# Patient Record
Sex: Male | Born: 1950 | Race: White | Hispanic: No | Marital: Married | Smoking: Never smoker
Health system: Southern US, Community
[De-identification: ages and names within clinical notes are randomized; demographics above are authoritative.]

## PROBLEM LIST (undated history)

## (undated) DIAGNOSIS — E781 Pure hyperglyceridemia: Secondary | ICD-10-CM

## (undated) DIAGNOSIS — M1990 Unspecified osteoarthritis, unspecified site: Secondary | ICD-10-CM

## (undated) DIAGNOSIS — K5790 Diverticulosis of intestine, part unspecified, without perforation or abscess without bleeding: Secondary | ICD-10-CM

## (undated) DIAGNOSIS — K635 Polyp of colon: Secondary | ICD-10-CM

## (undated) DIAGNOSIS — L409 Psoriasis, unspecified: Secondary | ICD-10-CM

## (undated) HISTORY — DX: Polyp of colon: K63.5

## (undated) HISTORY — DX: Pure hyperglyceridemia: E78.1

## (undated) HISTORY — DX: Psoriasis, unspecified: L40.9

## (undated) HISTORY — PX: COLONOSCOPY: SHX174

## (undated) HISTORY — DX: Diverticulosis of intestine, part unspecified, without perforation or abscess without bleeding: K57.90

## (undated) HISTORY — DX: Unspecified osteoarthritis, unspecified site: M19.90

---

## 2006-12-06 ENCOUNTER — Ambulatory Visit: Payer: Self-pay | Admitting: Internal Medicine

## 2006-12-18 ENCOUNTER — Ambulatory Visit: Payer: Self-pay | Admitting: Internal Medicine

## 2006-12-18 ENCOUNTER — Encounter: Payer: Self-pay | Admitting: Internal Medicine

## 2011-11-13 ENCOUNTER — Encounter: Payer: Self-pay | Admitting: Internal Medicine

## 2011-11-20 ENCOUNTER — Encounter: Payer: Self-pay | Admitting: Internal Medicine

## 2012-01-18 ENCOUNTER — Encounter: Payer: Self-pay | Admitting: Internal Medicine

## 2012-01-18 ENCOUNTER — Ambulatory Visit (AMBULATORY_SURGERY_CENTER): Payer: BC Managed Care – PPO | Admitting: *Deleted

## 2012-01-18 VITALS — Ht 74.0 in | Wt 208.0 lb

## 2012-01-18 DIAGNOSIS — Z86010 Personal history of colonic polyps: Secondary | ICD-10-CM

## 2012-01-18 DIAGNOSIS — Z1211 Encounter for screening for malignant neoplasm of colon: Secondary | ICD-10-CM

## 2012-01-18 MED ORDER — MOVIPREP 100 G PO SOLR: ORAL | Status: DC

## 2012-01-31 ENCOUNTER — Encounter: Payer: Self-pay | Admitting: Internal Medicine

## 2012-01-31 ENCOUNTER — Ambulatory Visit (AMBULATORY_SURGERY_CENTER): Payer: BC Managed Care – PPO | Admitting: Internal Medicine

## 2012-01-31 VITALS — BP 155/84 | HR 66 | Temp 97.5°F | Resp 16 | Ht 74.0 in | Wt 208.0 lb

## 2012-01-31 DIAGNOSIS — Z860100 Personal history of colon polyps, unspecified: Secondary | ICD-10-CM

## 2012-01-31 DIAGNOSIS — Z1211 Encounter for screening for malignant neoplasm of colon: Secondary | ICD-10-CM

## 2012-01-31 MED ORDER — SODIUM CHLORIDE 0.9 % IV SOLN: 500.0000 mL | INTRAVENOUS | Status: DC

## 2012-01-31 NOTE — Progress Notes (Signed)
Patient did not experience any of the following events: a burn prior to discharge; a fall within the facility; wrong site/side/patient/procedure/implant event; or a hospital transfer or hospital admission upon discharge from the facility. [phone]) Patient did not have preoperative order for IV antibiotic SSI prophylaxis. [phone])   Pt. Did not pass gas prior to discharge.  Abdomen soft, non tender.  Pt. States he has no need to pass gas.  Placed in trendelenberg Without any results.  Advised to be up and around walking today to facilitate gas.

## 2012-01-31 NOTE — Patient Instructions (Addendum)

## 2012-01-31 NOTE — Op Note (Signed)
[facility] [address] [place], [place]  [address]  COLONOSCOPY PROCEDURE REPORT  PATIENT:  Shane Larson, Shane Larson  MR#:  [id number] BIRTHDATE:  [birth year], 60 yrs. old  GENDER:  male ENDOSCOPIST:  Wilhemina Bonito. Eda Keys, MD REF. BY:  Surveillance Program Recall, PROCEDURE DATE:  01/31/2012 PROCEDURE:  Surveillance Colonoscopy ASA CLASS:  Class I INDICATIONS:  history of pre-cancerous (adenomatous) colon polyps, surveillance and high-risk screening ; index exam 11-2006 w/ small TAs MEDICATIONS:   MAC sedation, administered by CRNA, propofol (Diprivan) 350 mg IV  DESCRIPTION OF PROCEDURE:   After the risks benefits and alternatives of the procedure were thoroughly explained, informed consent was obtained.  Digital rectal exam was performed and revealed no abnormalities.   The LB CF-[id number] endoscope was introduced through the anus and advanced to the cecum, which was identified by both the appendix and ileocecal valve, without limitations.  The quality of the prep was excellent, using MoviPrep.  The instrument was then slowly withdrawn as the colon was fully examined. <<PROCEDUREIMAGES>>  FINDINGS:  Mild diverticulosis was found in the sigmoid colon. Otherwise normal colonoscopy without  polyps, masses, vascular ectasias, or inflammatory changes.   Retroflexed views in the rectum revealed internal hemorrhoids.    The time to cecum =  2:29 minutes. The scope was then withdrawn in  9:35  minutes from the cecum and the procedure completed.  COMPLICATIONS:  None  ENDOSCOPIC IMPRESSION: 1) Mild diverticulosis in the sigmoid colon 2) Otherwise normal colonoscopy 3) Internal hemorrhoids  RECOMMENDATIONS: 1) Follow up colonoscopy in 5 years  ______________________________ Wilhemina Bonito. Eda Keys, MD  CC:  Kari Baars, MD;  The Patient  n. eSIGNED:   Wilhemina Bonito. Eda Keys at 01/31/2012 10:28 AM  Val Riles, [phone]

## 2012-02-01 ENCOUNTER — Telehealth: Payer: Self-pay | Admitting: *Deleted

## 2012-02-01 NOTE — Telephone Encounter (Signed)
  Follow up Call-  Call back number 01/31/2012  Post procedure Call Back phone  # [phone]  Permission to leave phone message Yes     Patient questions:  Do you have a fever, pain , or abdominal swelling? no Pain Score  0 *  Have you tolerated food without any problems? yes  Have you been able to return to your normal activities? yes  Do you have any questions about your discharge instructions: Diet   no Medications  no Follow up visit  no  Do you have questions or concerns about your Care? no  Actions: * If pain score is 4 or above: No action needed, pain <4.

## 2014-01-08 ENCOUNTER — Ambulatory Visit (INDEPENDENT_AMBULATORY_CARE_PROVIDER_SITE_OTHER): Payer: BC Managed Care – PPO | Admitting: Family Medicine

## 2014-01-08 VITALS — BP 128/78 | HR 75 | Temp 97.8°F | Resp 16 | Ht 73.0 in | Wt 199.2 lb

## 2014-01-08 DIAGNOSIS — L03019 Cellulitis of unspecified finger: Secondary | ICD-10-CM

## 2014-01-08 DIAGNOSIS — L4050 Arthropathic psoriasis, unspecified: Secondary | ICD-10-CM

## 2014-01-08 DIAGNOSIS — L03011 Cellulitis of right finger: Secondary | ICD-10-CM

## 2014-01-08 DIAGNOSIS — L408 Other psoriasis: Secondary | ICD-10-CM

## 2014-01-08 DIAGNOSIS — L409 Psoriasis, unspecified: Secondary | ICD-10-CM | POA: Insufficient documentation

## 2014-01-08 MED ORDER — SULFAMETHOXAZOLE-TMP DS 800-160 MG PO TABS: 1.0000 | ORAL_TABLET | Freq: Two times a day (BID) | ORAL | Status: DC

## 2014-01-08 MED ORDER — HYDROCODONE-ACETAMINOPHEN 5-325 MG PO TABS: 1.0000 | ORAL_TABLET | Freq: Four times a day (QID) | ORAL | Status: DC | PRN

## 2014-01-08 MED ORDER — SULFAMETHOXAZOLE-TMP DS 800-160 MG PO TABS
1.0000 | ORAL_TABLET | Freq: Two times a day (BID) | ORAL | Status: DC
  Administered 2014-01-08: 1 via ORAL

## 2014-01-08 MED ORDER — SULFAMETHOXAZOLE-TMP DS 800-160 MG PO TABS
1.0000 | ORAL_TABLET | Freq: Once | ORAL | Status: AC
  Administered 2014-01-08: 1 via ORAL

## 2014-01-08 NOTE — Progress Notes (Signed)
Subjective: 63 year old man who had his finger start getting swollen and red yesterday morning. The right index finger. It is apparently had a previous infection at the base of the nail, which is caused the nail to be a little dystrophic. Is not diabetic  Objective: Very swollen and red right index finger, with the finger somewhat reddish always to the base of it. The swelling is most prominent right along the base of the fingernail. It is just a little bit fluctuant. He has a little deformity of the nail where he squeezed little pus out yesterday. He is wanting to be able to use it to play guitar tomorrow night  Assessment: Very inflamed paronychia right index finger  Plan: I&D Bactrim twice daily Hydrocodone for pain

## 2014-01-08 NOTE — Patient Instructions (Addendum)
Take Bactrim one twice daily for infection  Take hydrocodone every 4-6 hours if needed for throbbing severe pain.  Take ibuprofen 6-800 mg 3 times daily if needed for moderate pain  Return as needed

## 2014-01-08 NOTE — Progress Notes (Signed)
Procedure:  Consent obtained - MC block with 2% lido.  Local anesthesia obtained - betadine prep.  #11 blade used to make a hockey stick shaped - incision along the lateral aspect of the nail fold.  No purulent drainage from the area.  Drsg placed.  Wound care d/w pt with warm compresses and RTC in 24h if worse 48h if no better.

## 2014-01-09 ENCOUNTER — Telehealth: Payer: Self-pay

## 2014-01-09 NOTE — Telephone Encounter (Signed)
Patient was recently seen by Dr. Linna Darner. He was told to call back if he is feeling worse. He says he believes the infection is getting worse. He was told by Dr. Linna Darner to ask to speak to Windell Hummingbird about this.  Best: cell: [phone]

## 2014-01-11 NOTE — Telephone Encounter (Signed)
LMVM for pt to CB. 

## 2014-01-11 NOTE — Telephone Encounter (Signed)
Pt's questions answered. He was in a lot of pain Sat when he called but it's subsiding now.

## 2014-01-13 ENCOUNTER — Ambulatory Visit (INDEPENDENT_AMBULATORY_CARE_PROVIDER_SITE_OTHER): Payer: BC Managed Care – PPO | Admitting: Family Medicine

## 2014-01-13 ENCOUNTER — Telehealth: Payer: Self-pay

## 2014-01-13 VITALS — BP 128/78 | HR 58 | Temp 98.2°F | Resp 16 | Ht 73.0 in | Wt 202.4 lb

## 2014-01-13 DIAGNOSIS — IMO0002 Reserved for concepts with insufficient information to code with codable children: Secondary | ICD-10-CM

## 2014-01-13 DIAGNOSIS — L03011 Cellulitis of right finger: Secondary | ICD-10-CM

## 2014-01-13 MED ORDER — CEPHALEXIN 500 MG PO CAPS: 500.0000 mg | ORAL_CAPSULE | Freq: Four times a day (QID) | ORAL | Status: DC

## 2014-01-13 NOTE — Telephone Encounter (Signed)
Patient called. States he was seen for a cut under his finger and it is still swollen. Please return call and advise. Thank you!

## 2014-01-13 NOTE — Progress Notes (Signed)
63 yo man with right index finger paronychia lanced one week ago and treated with Bactrim.  He continues to have redness and swelling in the distal two phalanges.  The pain has largely subsided though  Objective:  NAD The distal two phalanges of the right index finger are mildly swollen and erythematous with no active discharge from the cuticle. ROM is normal and there is only mild tenderness   Assessment: I would have expected more resolution by now.  Paronychia, right - Plan: cephALEXin (KEFLEX) 500 MG capsule  Signed, Robyn Haber, MD  Stop Bactrim

## 2014-01-13 NOTE — Telephone Encounter (Signed)
Per OV notes pt was to RTC if not better in 48 hrs.  Called pt and advised him to RTC.  Pt stated he would come this afternoon.

## 2014-01-13 NOTE — Patient Instructions (Signed)

## 2014-01-15 NOTE — Addendum Note (Signed)
Addended by: HOPPER, DAVID H on: 01/15/2014 05:46 PM   Modules accepted: Level of Service

## 2014-01-20 ENCOUNTER — Ambulatory Visit (INDEPENDENT_AMBULATORY_CARE_PROVIDER_SITE_OTHER): Payer: BC Managed Care – PPO

## 2014-01-20 ENCOUNTER — Ambulatory Visit (INDEPENDENT_AMBULATORY_CARE_PROVIDER_SITE_OTHER): Payer: BC Managed Care – PPO | Admitting: Family Medicine

## 2014-01-20 VITALS — BP 153/85 | HR 66 | Temp 98.3°F | Resp 18 | Ht 73.0 in | Wt 202.0 lb

## 2014-01-20 DIAGNOSIS — IMO0002 Reserved for concepts with insufficient information to code with codable children: Secondary | ICD-10-CM

## 2014-01-20 DIAGNOSIS — M79644 Pain in right finger(s): Secondary | ICD-10-CM

## 2014-01-20 DIAGNOSIS — M79609 Pain in unspecified limb: Secondary | ICD-10-CM

## 2014-01-20 DIAGNOSIS — L03011 Cellulitis of right finger: Secondary | ICD-10-CM

## 2014-01-20 MED ORDER — CEPHALEXIN 500 MG PO CAPS: 500.0000 mg | ORAL_CAPSULE | Freq: Four times a day (QID) | ORAL | Status: DC

## 2014-01-20 NOTE — Progress Notes (Signed)
[facility] [address], [place] [address] [phone]  Date:  01/20/2014   Name:  Shane Larson   DOB:  [birth year]   MRN:  [id number]  PCP:  Marton Redwood, MD    Chief Complaint: Follow-up   History of Present Illness:  Shane Larson is a 63 y.o. very pleasant male patient who presents with the following:  Here today to recheck a finger issue.  He was originally here on 6/19 with a paronychia of the right index finger, treated with I and D/ bactrim.  He came back on the 24th and was given keflex because he was still symptomatic.   He just finished his course of keflex today- he is concerned because the finger is still somewhat stiff and swollen.  However it is no longer tender.  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   Otherwise he is now feeling ok.    He does have a history of psoriasis and psoriatic arthritis  Patient Active Problem List   Diagnosis Date Noted  . Psoriatic arthritis 01/08/2014  . Psoriasis 01/08/2014    Past Medical History  Diagnosis Date  . Colon polyps     adenomatous  . Diverticulosis   . Hemorrhoids   . Psoriasis     Past Surgical History  Procedure Laterality Date  . Colonoscopy      History  Substance Use Topics  . Smoking status: Never Smoker   . Smokeless tobacco: Never Used  . Alcohol Use: 12.6 oz/week    21 Cans of beer per week    Family History  Problem Relation Age of Onset  . Colon cancer Neg Hx     Allergies  Allergen Reactions  . Codeine Itching    Medication list has been reviewed and updated.  Current Outpatient Prescriptions on File Prior to Visit  Medication Sig Dispense Refill  . cephALEXin (KEFLEX) 500 MG capsule Take 1 capsule (500 mg total) by mouth 4 (four) times daily.  28 capsule  0  . HYDROcodone-acetaminophen (NORCO) 5-325 MG per tablet Take 1 tablet by mouth every 6 (six) hours as needed.  30  tablet  0  . LOVAZA 1 G capsule Take 2 tablets by mouth Daily.      . Multiple Vitamin (MULTIVITAMIN) tablet Take 1 tablet by mouth daily.      . Cholecalciferol (VITAMIN D-3 PO) Take 1 tablet by mouth daily.       No current facility-administered medications on file prior to visit.    Review of Systems:  As per HPI- otherwise negative.   Physical Examination: Filed Vitals:   01/20/14 1403  BP: 153/85  Pulse: 66  Temp: 98.3 F (36.8 C)  Resp: 18   Filed Vitals:   01/20/14 1403  Height: 6\' 1"  (1.854 m)  Weight: 202 lb (91.627 kg)   Body mass index is 26.66 kg/(m^2). Ideal Body Weight: Weight in (lb) to have BMI = 25: 189.1   GEN: WDWN, NAD, Non-toxic, Alert & Oriented x 3, looks well HEENT: Atraumatic, Normocephalic.  Ears and Nose: No external deformity. EXTR: No clubbing/cyanosis/edema NEURO: Normal gait.  PSYCH: Normally interactive. Conversant. Not depressed or anxious appearing.  Calm demeanor.  Right index finger: he has what appears to be chronic inflammation of the distal phalanx, and some stiffness of the DIP joint. No heat or tenderness.  He has about 50% normal  flexion of the joint.    [facility] reading (PRIMARY) by  Dr. Lorelei Pont. 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  FINDINGS: The radiocarpal joint space appears normal and the carpal bones are in normal position. The ulnar styloid is intact. MCP, PIP, and DIP joints appear normal. No erosion is seen. The tufts of the distal phalanges appear intact. No demineralization is noted.  IMPRESSION: Negative.  Assessment and Plan: Pain of finger of right hand - Plan: DG Hand Complete Right  Paronychia, right - Plan: cephALEXin (KEFLEX) 500 MG capsule  At this point Albertina Parr does not appear to have an active infection.  However the end of his finger is still red and slightly swollen.  I do think he can go back to his usual activities and music.   In fact moving the finger will likely allow him to regain ROM.  Did give him an antibiotic rx to hold- he can start this if the finger gets worse again.   See patient instructions for more details.     Signed Lamar Blinks, MD

## 2014-01-20 NOTE — Telephone Encounter (Signed)
Pt called in and said he had seen Dr Joseph Art for an infected finger-he is taking the last of the antibiotic today and states that it doesn't look much better. He says it is still swelling and red. I advised him that Dr Joseph Art is off till the 16th and he just wanted someone to call him and to discuss what he should do. He can be reached [phone]. Thank you

## 2014-01-20 NOTE — Telephone Encounter (Signed)
Pt is on his last dose of antibiotic. He reports the finger is unchanged from his visit.  Advised pt to RTC. States he will be in today or tomorrow morning.

## 2014-01-20 NOTE — Patient Instructions (Signed)
You can go back to your regular activities and music.  If your finger does not regain it's range of motion in the next month or so just let me know and I will refer you to hand therapy.  For the time being hold onto the keflex antibiotic- use this if you develop any worsening redness or do not continue to improve.

## 2014-01-28 ENCOUNTER — Telehealth: Payer: Self-pay

## 2014-01-28 NOTE — Telephone Encounter (Signed)
PATIENT RECEIVED X-RAY RESULTS AND WOULD LIKE TO KNOW IF IT'S THE RIGHT X-RAY OR IF THERE COULD BE A POSSIBLE MIX UP. PATIENT WOULD PREFER TO SPEAK WITH DR. Lorelei Pont BUT IS ALSO WILLING TO RECEIVE A CALL BACK FROM THE FIRST AVAILABLE. PLEASE CALL PATIENT! : [phone]

## 2014-01-28 NOTE — Telephone Encounter (Signed)
[facility] reading (PRIMARY) by Dr. Lorelei Pont.  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 Thanks

## 2014-01-29 NOTE — Telephone Encounter (Signed)
Called him back- his finger is better, the last signs of redness and swelling are going away.  Offered to have him see PT or ortho- he would prefer to see how he does over the next week or so and will let me know

## 2014-02-09 ENCOUNTER — Telehealth: Payer: Self-pay

## 2014-02-09 NOTE — Telephone Encounter (Signed)
Spoke pt- he states he is doing well. He still has a little inflammation near the bone spur. He has been playing guitar and the swelling has gone down a lot. He is going to wait to see how well it heals. He does not want to be referred to Ortho or PT.

## 2014-02-09 NOTE — Telephone Encounter (Signed)
PT STATES HE SAW DR COPLAND THE OTHER DAY AND WOULD LIKE TO SPEAK WITH HER REGARDING HIS VISIT PLEASE CALL [phone]

## 2014-10-12 ENCOUNTER — Other Ambulatory Visit: Payer: Self-pay | Admitting: Internal Medicine

## 2014-10-12 DIAGNOSIS — R740 Nonspecific elevation of levels of transaminase and lactic acid dehydrogenase [LDH]: Principal | ICD-10-CM

## 2014-10-12 DIAGNOSIS — R601 Generalized edema: Secondary | ICD-10-CM

## 2014-10-12 DIAGNOSIS — R7401 Elevation of levels of liver transaminase levels: Secondary | ICD-10-CM

## 2014-10-13 ENCOUNTER — Ambulatory Visit
Admission: RE | Admit: 2014-10-13 | Discharge: 2014-10-13 | Disposition: A | Discharge status: Home / Self Care | Payer: BLUE CROSS/BLUE SHIELD | Source: Ambulatory Visit | Attending: Internal Medicine | Admitting: Internal Medicine

## 2014-10-13 ENCOUNTER — Other Ambulatory Visit: Payer: Self-pay | Admitting: Internal Medicine

## 2014-10-13 ENCOUNTER — Encounter: Payer: Self-pay | Admitting: Internal Medicine

## 2014-10-13 DIAGNOSIS — R740 Nonspecific elevation of levels of transaminase and lactic acid dehydrogenase [LDH]: Principal | ICD-10-CM

## 2014-10-13 DIAGNOSIS — R601 Generalized edema: Secondary | ICD-10-CM

## 2014-10-13 DIAGNOSIS — R7401 Elevation of levels of liver transaminase levels: Secondary | ICD-10-CM

## 2014-10-13 DIAGNOSIS — K7469 Other cirrhosis of liver: Secondary | ICD-10-CM

## 2014-10-20 ENCOUNTER — Other Ambulatory Visit: Payer: Self-pay | Admitting: Internal Medicine

## 2014-10-20 DIAGNOSIS — K7469 Other cirrhosis of liver: Secondary | ICD-10-CM

## 2014-10-22 ENCOUNTER — Ambulatory Visit
Admission: RE | Admit: 2014-10-22 | Discharge: 2014-10-22 | Disposition: A | Discharge status: Home / Self Care | Payer: BLUE CROSS/BLUE SHIELD | Source: Ambulatory Visit | Attending: Internal Medicine | Admitting: Internal Medicine

## 2014-10-22 DIAGNOSIS — K7469 Other cirrhosis of liver: Secondary | ICD-10-CM

## 2014-10-22 MED ORDER — IOPAMIDOL (ISOVUE-300) INJECTION 61%
100.0000 mL | Freq: Once | INTRAVENOUS | Status: AC | PRN
  Administered 2014-10-22: 100 mL via INTRAVENOUS

## 2014-10-26 ENCOUNTER — Encounter (HOSPITAL_COMMUNITY): Payer: Self-pay

## 2014-10-26 ENCOUNTER — Other Ambulatory Visit (HOSPITAL_COMMUNITY): Payer: Self-pay | Admitting: Internal Medicine

## 2014-10-26 ENCOUNTER — Ambulatory Visit (HOSPITAL_COMMUNITY)
Admission: RE | Admit: 2014-10-26 | Discharge: 2014-10-26 | Disposition: A | Discharge status: Home / Self Care | Payer: BLUE CROSS/BLUE SHIELD | Source: Ambulatory Visit | Attending: Internal Medicine | Admitting: Internal Medicine

## 2014-10-26 ENCOUNTER — Other Ambulatory Visit: Payer: Self-pay | Admitting: Internal Medicine

## 2014-10-26 DIAGNOSIS — Z79899 Other long term (current) drug therapy: Secondary | ICD-10-CM | POA: Diagnosis not present

## 2014-10-26 DIAGNOSIS — R160 Hepatomegaly, not elsewhere classified: Secondary | ICD-10-CM

## 2014-10-26 DIAGNOSIS — R7989 Other specified abnormal findings of blood chemistry: Secondary | ICD-10-CM | POA: Diagnosis not present

## 2014-10-26 DIAGNOSIS — R109 Unspecified abdominal pain: Secondary | ICD-10-CM | POA: Insufficient documentation

## 2014-10-26 DIAGNOSIS — K589 Irritable bowel syndrome without diarrhea: Secondary | ICD-10-CM | POA: Diagnosis not present

## 2014-10-26 DIAGNOSIS — R600 Localized edema: Secondary | ICD-10-CM | POA: Insufficient documentation

## 2014-10-26 DIAGNOSIS — L409 Psoriasis, unspecified: Secondary | ICD-10-CM | POA: Diagnosis not present

## 2014-10-26 DIAGNOSIS — K5790 Diverticulosis of intestine, part unspecified, without perforation or abscess without bleeding: Secondary | ICD-10-CM | POA: Diagnosis not present

## 2014-10-26 DIAGNOSIS — Z5329 Procedure and treatment not carried out because of patient's decision for other reasons: Secondary | ICD-10-CM | POA: Insufficient documentation

## 2014-10-26 LAB — CBC
HCT: 46.3 % (ref 39.0–52.0)
HEMOGLOBIN: 15.6 g/dL (ref 13.0–17.0)
MCH: 31.6 pg (ref 26.0–34.0)
MCHC: 33.7 g/dL (ref 30.0–36.0)
MCV: 93.9 fL (ref 78.0–100.0)
Platelets: 69 10*3/uL — ABNORMAL LOW (ref 150–400)
RBC: 4.93 MIL/uL (ref 4.22–5.81)
RDW: 13 % (ref 11.5–15.5)
WBC: 8.7 10*3/uL (ref 4.0–10.5)

## 2014-10-26 LAB — PROTIME-INR
INR: 1.12 (ref 0.00–1.49)
PROTHROMBIN TIME: 14.5 s (ref 11.6–15.2)

## 2014-10-26 LAB — APTT: APTT: 24 s (ref 24–37)

## 2014-10-26 MED ORDER — FENTANYL CITRATE 0.05 MG/ML IJ SOLN
INTRAMUSCULAR | Status: AC | PRN
  Administered 2014-10-26 (×3): 25 ug via INTRAVENOUS

## 2014-10-26 MED ORDER — GELATIN ABSORBABLE 12-7 MM EX MISC
CUTANEOUS | Status: AC
  Filled 2014-10-26: qty 1

## 2014-10-26 MED ORDER — MIDAZOLAM HCL 2 MG/2ML IJ SOLN
INTRAMUSCULAR | Status: AC
  Filled 2014-10-26: qty 2

## 2014-10-26 MED ORDER — LIDOCAINE-EPINEPHRINE 1 %-1:100000 IJ SOLN
INTRAMUSCULAR | Status: AC
  Filled 2014-10-26: qty 1

## 2014-10-26 MED ORDER — FENTANYL CITRATE 0.05 MG/ML IJ SOLN
INTRAMUSCULAR | Status: AC
  Filled 2014-10-26: qty 2

## 2014-10-26 MED ORDER — SODIUM CHLORIDE 0.9 % IV SOLN
INTRAVENOUS | Status: DC
Start: 2014-10-26 — End: 2014-10-27

## 2014-10-26 MED ORDER — MIDAZOLAM HCL 2 MG/2ML IJ SOLN
INTRAMUSCULAR | Status: AC | PRN
  Administered 2014-10-26 (×3): 0.5 mg via INTRAVENOUS

## 2014-10-26 NOTE — Procedures (Signed)
Technically successful US guided biopsy of indeterminate lesion in left lobe of liver.  No immediate complications.

## 2014-10-26 NOTE — Progress Notes (Signed)
Pt. Refuses to wait any further for procedure; states he and friend "have to go to [place] today".  Pt states he did not know he would have bedrest following procedure.  Dr. Martinique notified; Rescheduled for 10/27/14 1000. Pt instructed to be at admitting at 0800, also he would need transportation home.  Pt. Verbalizes understanding.

## 2014-10-26 NOTE — Discharge Instructions (Signed)
Liver Biopsy, Care After °These instructions give you information on caring for yourself after your procedure. Your doctor may also give you more specific instructions. Call your doctor if you have any problems or questions after your procedure. °HOME CARE °· Rest at home for 1-2 days or as told by your doctor. °· Have someone stay with you for at least 24 hours. °· Do not do these things in the first 24 hours: °¨ Drive. °¨ Use machinery. °¨ Take care of other people. °¨ Sign legal documents. °¨ Take a bath or shower. °· There are many different ways to close and cover a cut (incision). For example, a cut can be closed with stitches, skin glue, or adhesive strips. Follow your doctor's instructions on: °¨ Taking care of your cut. °¨ Changing and removing your bandage (dressing). °¨ Removing whatever was used to close your cut. °· Do not drink alcohol in the first week. °· Do not lift more than 5 pounds or play contact sports for the first 2 weeks. °· Take medicines only as told by your doctor. For 1 week, do not take medicine that has aspirin in it or medicines like ibuprofen. °· Get your test results. °GET HELP IF: °· A cut bleeds and leaves more than just a small spot of blood. °· A cut is red, puffs up (swells), or hurts more than before. °· Fluid or something else comes from a cut. °· A cut smells bad. °· You have a fever or chills. °GET HELP RIGHT AWAY IF: °· You have swelling, bloating, or pain in your belly (abdomen). °· You get dizzy or faint. °· You have a rash. °· You feel sick to your stomach (nauseous) or throw up (vomit). °· You have trouble breathing, feel short of breath, or feel faint. °· Your chest hurts. °· You have problems talking or seeing. °· You have trouble balancing or moving your arms or legs. °Document Released: 04/17/2008 Document Revised: 11/23/2013 Document Reviewed: 09/04/2013 °[employer]® Patient Information ©2015 [employer]. This information is not intended to replace advice given to  you by your health care provider. Make sure you discuss any questions you have with your health care provider. ° °

## 2014-10-26 NOTE — H&P (Signed)
Chief Complaint: Abd pain; lower extremity edema Liver lesions  Referring Physician(s): Shaw,William  History of Present Illness: Shane Larson is a 64 y.o. male   Pt complained of abd pain; bloating and gas 2 months ago MD treated pt for IBS Sxs did not resolve and noted B leg edema Was then treated for edema and work up revealed increased Liver function tests US shows irregular liver with multiple lesions CT to follow confirms findings Request for liver lesion biopsy    Past Medical History  Diagnosis Date  . Colon polyps     adenomatous  . Diverticulosis   . Hemorrhoids   . Psoriasis     Past Surgical History  Procedure Laterality Date  . Colonoscopy      Allergies: Codeine  Medications: Prior to Admission medications   Medication Sig Start Date End Date Taking? Authorizing Provider  LOVAZA 1 G capsule Take 2 tablets by mouth Daily. 12/28/11  Yes Historical Provider, MD  Multiple Vitamin (MULTIVITAMIN) tablet Take 1 tablet by mouth daily.   Yes Historical Provider, MD  cephALEXin (KEFLEX) 500 MG capsule Take 1 capsule (500 mg total) by mouth 4 (four) times daily. Patient not taking: Reported on 10/26/2014 01/20/14   Darreld Mclean, MD  HYDROcodone-acetaminophen (NORCO) 5-325 MG per tablet Take 1 tablet by mouth every 6 (six) hours as needed. Patient not taking: Reported on 10/26/2014 01/08/14   Posey Boyer, MD     Family History  Problem Relation Age of Onset  . Colon cancer Neg Hx     History   Social History  . Marital Status: Married    Spouse Name: N/A  . Number of Children: N/A  . Years of Education: N/A   Social History Main Topics  . Smoking status: Never Smoker   . Smokeless tobacco: Never Used  . Alcohol Use: 12.6 oz/week    21 Cans of beer per week  . Drug Use: No  . Sexual Activity: Not on file   Other Topics Concern  . None   Social History Narrative    Review of Systems: A 12 point ROS discussed and pertinent positives are  indicated in the HPI above.  All other systems are negative.  Review of Systems  Constitutional: Positive for activity change, appetite change and fatigue.  Respiratory: Negative for cough and shortness of breath.   Cardiovascular: Positive for leg swelling. Negative for chest pain.  Gastrointestinal: Positive for nausea, abdominal pain and abdominal distention. Negative for vomiting.  Neurological: Positive for weakness.  Psychiatric/Behavioral: Negative for behavioral problems and confusion.    Vital Signs: BP 158/75 mmHg  Pulse 43  Temp(Src) 98.3 F (36.8 C) (Oral)  Resp 18  Ht 6' 2.5" (1.892 m)  Wt 93.895 kg (207 lb)  BMI 26.23 kg/m2  SpO2 97%  Physical Exam  Constitutional: He is oriented to person, place, and time. He appears well-developed.  thin  Cardiovascular: Normal rate, regular rhythm and normal heart sounds.   No murmur heard. Pulmonary/Chest: Effort normal and breath sounds normal. He has no wheezes.  Abdominal: Soft. He exhibits distension. There is tenderness.  Musculoskeletal: Normal range of motion.  Neurological: He is alert and oriented to person, place, and time.  Skin: Skin is warm and dry.  Psychiatric: He has a normal mood and affect. His behavior is normal. Judgment and thought content normal.  Nursing note and vitals reviewed.   Mallampati Score:  MD Evaluation Airway: WNL Heart: WNL Abdomen: WNL Chest/ Lungs:  WNL ASA  Classification: 3 Mallampati/Airway Score: One  Imaging: Ct Abdomen Pelvis W Wo Contrast  10/22/2014   CLINICAL DATA:  Abnormal liver ultrasound  EXAM: CT ABDOMEN AND PELVIS WITHOUT AND WITH CONTRAST  TECHNIQUE: Multidetector CT imaging of the abdomen and pelvis was performed following the standard protocol before and following the bolus administration of intravenous contrast.  CONTRAST:  100 mL Isovue 300 IV  COMPARISON:  Abdominal ultrasound dated 10/13/2014  FINDINGS: Lower chest:  Lung bases are clear.  Hepatobiliary:  Innumerable hypoenhancing lesions throughout the liver. Dominant discrete lesion measures 6.1 x 4.6 cm in the posterior segment right hepatic lobe (series 8/image 50). Additional discrete lesion in the superior right hepatic dome measures 2.4 x 3.3 cm (series 8/ image 35). This appearance is worrisome for metastatic disease. Infiltrating hepatocellular tumor is considered less likely.  Overlying nodular hepatic contour of the liver, possibly reflecting cirrhosis, although "pseudocirrhosis" (related to metastases) is also possible.  Gallbladder is underdistended but unremarkable. No intrahepatic or extrahepatic ductal dilatation.  Pancreas: Within normal limits.  Spleen: Within normal limits.  Adrenals/Urinary Tract: Adrenal glands are thickened bilaterally. No discrete mass.  Kidneys are within normal limits.  No hydronephrosis.  Bladder is within normal limits.  Stomach/Bowel: Stomach is within normal limits.  No evidence of bowel obstruction.  Normal appendix.  Moderate colonic stool burden.  Vascular/Lymphatic: Atherosclerotic calcifications of the abdominal aorta and branch vessels.  Portal vein is patent.  Small upper abdominal lymph nodes which do not meet pathologic CT size criteria.  Reproductive: Mild prostatomegaly.  Other: Trace perihepatic and pelvic ascites.  Body wall edema.  Musculoskeletal: Degenerative changes of the visualized thoracolumbar spine.  IMPRESSION: Innumerable hypoenhancing hepatic lesions, suspicious for metastases, less likely infiltrating HCC.  Primary neoplasm in the abdomen/pelvis is not evident. Consider chest radiograph/CT for evaluation of lung primary. If a lung primary is not discovered, consider colonoscopy.  Bilateral adrenal thickening, indeterminate.  Small volume perihepatic and pelvic ascites.  Body wall edema.  These results will be called to the ordering clinician or representative by the Radiologist Assistant, and communication documented in the PACS or zVision  Dashboard.   Electronically Signed   By: Julian Hy M.D.   On: 10/22/2014 18:53   US Abdomen Complete  10/13/2014   CLINICAL DATA:  Elevated LFTs.  EXAM: ULTRASOUND ABDOMEN COMPLETE  COMPARISON:  None.  FINDINGS: Gallbladder: No gallstones noted. Gallbladder is contracted. Gallbladder wall is thickened is 7.8 mm. This may be from contracted state and/or hypoproteinemia. Cholecystitis cannot be completely excluded. Negative Murphy sign. No pericholecystic fluid collection.  Common bile duct: Diameter: 3.3 mm  Liver: Liver echotexture is heterogeneous. Liver contour is slightly irregular. Hepatocellular disease including cirrhosis cannot be excluded. Multiple hyperechoic lesions are noted throughout the liver. These measure up to 1.8 cms. These could be related to hemangiomas, regenerating nodules, and metastatic disease. A 2.4 x 2.7 x 3.3 cm hypoechoic lesion is in the left hepatic lobe. This could represent a primary or metastatic hepatic tumor. 11 mm simple cyst noted in the left hepatic lobe. MRI of the liver with gadolinium enhancement suggested to further evaluate these hepatic findings.  IVC: No abnormality visualized.  Pancreas: Visualized portion unremarkable.  Spleen: Size and appearance within normal limits.  Right Kidney: Length: 13.0 cm. Echogenicity within normal limits. No mass or hydronephrosis visualized.  Left Kidney: Length: 13.6 cm. Echogenicity within normal limits. No mass or hydronephrosis visualized.  Abdominal aorta: No aneurysm visualized.  Other findings: Minimal ascites noted.  IMPRESSION: 1. Liver texture is heterogeneous. Contour is irregular. Hepatocellular disease including cirrhosis cannot be excluded. 2. Multiple hyperechoic hepatic lesions are noted. Although these could represent hemangiomas, regenerating nodules and/or metastatic disease could present this fashion. There is a 2.7 cm left hepatic lobe hypoechoic lesion. This could represent primary or metastatic  malignancy. 11 mm simple cyst in the left hepatic lobe. MRI of the liver with gadolinium enhancement suggested to further evaluate these findings. 3. Contracted gallbladder. Gallbladder wall thickness is 7.8 mm. This may be from contracted state and/ or hypoproteinemia. Cholecystitis cannot be completely excluded. No gallstones. Negative Murphy sign. 4. Mild ascites.   Electronically Signed   By: Marcello Moores  Register   On: 10/13/2014 10:51    Labs:  CBC: No results for input(s): WBC, HGB, HCT, PLT in the last 8760 hours.  COAGS: No results for input(s): INR, APTT in the last 8760 hours.  BMP: No results for input(s): NA, K, CL, CO2, GLUCOSE, BUN, CALCIUM, CREATININE, GFRNONAA, GFRAA in the last 8760 hours.  Invalid input(s): CMP  LIVER FUNCTION TESTS: No results for input(s): BILITOT, AST, ALT, ALKPHOS, PROT, ALBUMIN in the last 8760 hours.  TUMOR MARKERS: No results for input(s): AFPTM, CEA, CA199, CHROMGRNA in the last 8760 hours.  Assessment and Plan:  Elevated LFTs Liver lesions Now scheduled for liver lesion bx Risks and Benefits discussed with the patient including, but not limited to bleeding, infection, damage to adjacent structures or low yield requiring additional tests. All of the patient's questions were answered, patient is agreeable to proceed. Consent signed and in chart.   Thank you for this interesting consult.  I greatly enjoyed meeting Dareon Nunziato and look forward to participating in their care.  Signed: Maritta Kief A 10/26/2014, 1:07 PM   I spent a total of  20 Minutes   in face to face in clinical consultation, greater than 50% of which was counseling/coordinating care for liver lesion bx

## 2014-10-26 NOTE — Sedation Documentation (Signed)
Patient denies pain and is resting comfortably.  

## 2014-10-27 ENCOUNTER — Other Ambulatory Visit: Payer: BLUE CROSS/BLUE SHIELD

## 2014-10-28 ENCOUNTER — Ambulatory Visit
Admission: RE | Admit: 2014-10-28 | Discharge: 2014-10-28 | Disposition: A | Discharge status: Home / Self Care | Payer: BLUE CROSS/BLUE SHIELD | Source: Ambulatory Visit | Attending: Internal Medicine | Admitting: Internal Medicine

## 2014-10-28 DIAGNOSIS — R160 Hepatomegaly, not elsewhere classified: Secondary | ICD-10-CM

## 2014-10-28 MED ORDER — IOPAMIDOL (ISOVUE-300) INJECTION 61%
75.0000 mL | Freq: Once | INTRAVENOUS | Status: AC | PRN
  Administered 2014-10-28: 75 mL via INTRAVENOUS

## 2014-10-29 ENCOUNTER — Telehealth: Payer: Self-pay | Admitting: Oncology

## 2014-10-29 ENCOUNTER — Telehealth: Payer: Self-pay | Admitting: *Deleted

## 2014-10-29 NOTE — Telephone Encounter (Signed)
Called pt with change to 4/14 appointments. Instructed him to arrive at 3:30 for a 4 PM appointment. He voiced understanding and agreement.

## 2014-10-29 NOTE — Telephone Encounter (Signed)
NEW PATIENT APPT-S/W PATIENT AND GAVE NP APPT FOR 04/14 @ 12:45 W/DR. [place]

## 2014-11-02 ENCOUNTER — Ambulatory Visit: Payer: BLUE CROSS/BLUE SHIELD | Admitting: Gastroenterology

## 2014-11-04 ENCOUNTER — Ambulatory Visit: Payer: BLUE CROSS/BLUE SHIELD

## 2014-11-04 ENCOUNTER — Encounter: Payer: Self-pay | Admitting: Oncology

## 2014-11-04 ENCOUNTER — Encounter (INDEPENDENT_AMBULATORY_CARE_PROVIDER_SITE_OTHER): Payer: Self-pay

## 2014-11-04 ENCOUNTER — Ambulatory Visit (HOSPITAL_BASED_OUTPATIENT_CLINIC_OR_DEPARTMENT_OTHER): Payer: BLUE CROSS/BLUE SHIELD | Admitting: Oncology

## 2014-11-04 ENCOUNTER — Ambulatory Visit: Payer: BLUE CROSS/BLUE SHIELD | Admitting: Oncology

## 2014-11-04 VITALS — BP 161/76 | HR 51 | Temp 98.3°F | Resp 18 | Ht 74.5 in | Wt 202.8 lb

## 2014-11-04 DIAGNOSIS — C7B8 Other secondary neuroendocrine tumors: Secondary | ICD-10-CM

## 2014-11-04 DIAGNOSIS — C801 Malignant (primary) neoplasm, unspecified: Secondary | ICD-10-CM

## 2014-11-04 DIAGNOSIS — C7A8 Other malignant neuroendocrine tumors: Secondary | ICD-10-CM | POA: Diagnosis not present

## 2014-11-04 NOTE — Progress Notes (Signed)
[facility] New Patient Consult   Referring MD: Marton Redwood, Md [address] [place], [place] [address]   Mihir Flanigan 64 y.o.  August 01, 1950    Reason for Referral: Neuroendocrine carcinoma involving the liver   HPI: Mr. Wisler reports abdominal pain and fullness for several months. He developed leg swelling approximately 1 month ago. He saw Dr. Brigitte Pulse. An ultrasound of the abdomen on 10/13/2014 revealed a heterogenous liver echotexture. Multiple hyperechoic lesions are noted throughout the liver.  A laboratory evaluation by Dr. Brigitte Pulse included a negative study for hemochromatosis, negative hepatitis C virus antibody, mildly elevated ammonia level, negative hepatitis B surface antigen, ferritin 420, albumin 2.7, bilirubin 1.5, and a creatinine of 0.7. A CBC found the white count 12 with an absolute neutrophil count of 10.1, hemoglobin 15.2, and platelets 126,000.  He was referred for CT of the abdomen on 10/22/2014. This revealed innumerable hypoenhancing lesions throughout the liver. The appearance was worrisome for metastatic disease with an infiltrating hepatocellular tumor felt to be less likely. Overlying nodular hepatic contour potentially reflecting cirrhosis. The pancreas and spleen appeared normal. Body wall edema.  A CT of the chest 10/28/2014 revealed no evidence of malignancy in the chest. He underwent an ultrasound-guided biopsy of a left liver lesion on 10/26/2014. The pathology [id number]) confirmed a neuroendocrine neoplasm. The tumor cells stained positive for CD 56, chromogranin, synaptophysin, cytokeratin 7, and focally positive for TTF-1. The differential diagnosis included a metastatic neuroendocrine tumor of pancreatic biliary or gastrointestinal origin.  He was started on a diuretic regimen by Dr. Brigitte Pulse. He reports mild improvement in the leg edema. He no longer has abdominal fullness or pain.  Past Medical History  Diagnosis Date  . Colon polyps    adenomatous  . Diverticulosis   . Hemorrhoids   . Psoriasis   . Arthritis     psoriatic arthritis  . Hypertriglyceridemia     Past Surgical History  Procedure Laterality Date  . Colonoscopy-mild diverticulosis, otherwise normal    01/31/2012     Medications: Reviewed  Allergies:  Allergies  Allergen Reactions  . Codeine Itching    Family history: A maternal cousin had "liver cancer ".  Social History:   He is retired from an office occupation. He quit drinking alcohol 2 months ago. He does not smoke cigarettes. No risk factor for HIV or hepatitis.  History  Alcohol Use  . 12.6 oz/week  . 21 Cans of beer per week    History  Smoking status  . Never Smoker   Smokeless tobacco  . Never Used      ROS:   Positives include: Bilateral leg swelling, "lightheaded "after taking diuretics, a few night sweats around the neck, intermittent nausea-no vomiting  A complete ROS was otherwise negative.  Physical Exam:  Blood pressure 161/76, pulse 51, temperature 98.3 F (36.8 C), temperature source Oral, resp. rate 18, height 6' 2.5" (1.892 m), weight 202 lb 12.8 oz (91.989 kg), SpO2 99 %.  HEENT: Oropharynx without visible mass, neck without mass Lungs: Clear bilaterally Cardiac: Regular rate and rhythm Abdomen: No hepatomegaly, nontender, no mass, no splenomegaly GU: Testes without mass  Vascular: Pitting edema throughout the legs bilaterally. Lymph nodes: No cervical, supraclavicular, axillary, or inguinal nodes Neurologic: Alert and oriented, the motor exam appears intact in the upper and lower extremities Skin: No rash Musculoskeletal: No spine tenderness   LAB:  CBC  Lab Results  Component Value Date   WBC 8.7 10/26/2014   HGB 15.6 10/26/2014  HCT 46.3 10/26/2014   MCV 93.9 10/26/2014   PLT 69* 10/26/2014       Imaging: As per history of present illness, I reviewed the 10/22/2014 CT abdomen/pelvis with Mr. Mcmichael   Assessment/Plan:    1. 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 History of alcohol use  3.   CT scan evidence of cirrhosis 10/22/2014  4.   Thrombocytopenia   Disposition:   Mr. Slovacek has been diagnosed with a neuroendocrine tumor involving the liver. He appears to have extensive liver metastases. I discussed the case with Dr. Lyndon Code from pathology. He indicates this is not a high-grade lesion such as a small cell carcinoma. He feels the lesion most likely represents a carcinoid tumor. Mr. Cislo does not have symptoms to suggest carcinoid syndrome and no primary tumor site was identified on the staging CT scans. We will obtain a chromogranin A level and 24-hour urine 5 HIAA over the next several days. If the chromogranin A is elevated he will be scheduled for an octreoscan. If not we will consider a staging PET scan.  I recommended he elevate the legs to help with the edema. He will continue the furosemide and Aldactone as prescribed by Dr. Brigitte Pulse.  Mr. Petrosky will return for an office visit and further discussion in one week. It is unlikely he would respond to a systemic chemotherapy regimen such as etoposide/carboplatin since the tumor is not high-grade. We will consider tyrosine kinase inhibitor therapy and radioembolization pending the diagnostic workup above.  I will present his case at the GI tumor conference 11/10/2014.  Approximately 50 minutes were spent with the patient today. The majority of the time was used for counseling and coordination of care.  [place], [place] 11/04/2014, 4:36 PM

## 2014-11-04 NOTE — [facility] Oncology Navigator Note (Signed)
Met with patient and wife during new patient visit. Explained the role of the GI Nurse Navigator and provided New Patient Packet with information on: 1.  Neuroendocrine/carcinoid cancer 2. Support groups 3. Advanced Directives 4. Fall Safety Plan 5. Lab studies ordered and prep Answered questions, reviewed current treatment plan using TEACH back and provided emotional support. Provided copy of current treatment plan.  Merceda Elks, RN, BSN GI Oncology [facility]

## 2014-11-04 NOTE — Progress Notes (Signed)
Checked in new patient with no issues prior to seeing the dr. He has not been traveling. Wife left fmla forms and I will call her when done.

## 2014-11-05 ENCOUNTER — Telehealth: Payer: Self-pay | Admitting: Oncology

## 2014-11-05 NOTE — Telephone Encounter (Signed)
Called and left a message with 4/18 and 4/22 appointments

## 2014-11-08 ENCOUNTER — Telehealth: Payer: Self-pay | Admitting: *Deleted

## 2014-11-08 ENCOUNTER — Other Ambulatory Visit (HOSPITAL_BASED_OUTPATIENT_CLINIC_OR_DEPARTMENT_OTHER): Payer: BLUE CROSS/BLUE SHIELD

## 2014-11-08 DIAGNOSIS — C7B8 Other secondary neuroendocrine tumors: Secondary | ICD-10-CM

## 2014-11-08 DIAGNOSIS — C801 Malignant (primary) neoplasm, unspecified: Secondary | ICD-10-CM

## 2014-11-08 LAB — COMPREHENSIVE METABOLIC PANEL (CC13)
ALT: 90 U/L — AB (ref 0–55)
ANION GAP: 11 meq/L (ref 3–11)
AST: 40 U/L — AB (ref 5–34)
Albumin: 2.8 g/dL — ABNORMAL LOW (ref 3.5–5.0)
Alkaline Phosphatase: 125 U/L (ref 40–150)
BILIRUBIN TOTAL: 1.68 mg/dL — AB (ref 0.20–1.20)
BUN: 9.6 mg/dL (ref 7.0–26.0)
Calcium: 8 mg/dL — ABNORMAL LOW (ref 8.4–10.4)
Chloride: 93 mEq/L — ABNORMAL LOW (ref 98–109)
Creatinine: 0.7 mg/dL (ref 0.7–1.3)
EGFR: >90 mL/min/{1.73_m2} (ref >90)
Glucose: 118 mg/dl (ref 70–140)
Potassium: 2.2 mEq/L — CL (ref 3.5–5.1)
SODIUM: 143 meq/L (ref 136–145)
TOTAL PROTEIN: 4.9 g/dL — AB (ref 6.4–8.3)

## 2014-11-08 LAB — CBC WITH DIFFERENTIAL/PLATELET
BASO%: 0 % (ref 0.0–2.0)
Basophils Absolute: 0 10*3/uL (ref 0.0–0.1)
EOS ABS: 0 10*3/uL (ref 0.0–0.5)
EOS%: 0 % (ref 0.0–7.0)
HEMATOCRIT: 43.4 % (ref 38.4–49.9)
HEMOGLOBIN: 14.9 g/dL (ref 13.0–17.1)
LYMPH%: 8 % — ABNORMAL LOW (ref 14.0–49.0)
MCH: 31.7 pg (ref 27.2–33.4)
MCHC: 34.3 g/dL (ref 32.0–36.0)
MCV: 92.3 fL (ref 79.3–98.0)
MONO#: 0.3 10*3/uL (ref 0.1–0.9)
MONO%: 4.9 % (ref 0.0–14.0)
NEUT#: 6 10*3/uL (ref 1.5–6.5)
NEUT%: 87.1 % — ABNORMAL HIGH (ref 39.0–75.0)
Platelets: 69 10*3/uL — ABNORMAL LOW (ref 140–400)
RBC: 4.7 10*6/uL (ref 4.20–5.82)
RDW: 12.5 % (ref 11.0–14.6)
WBC: 6.9 10*3/uL (ref 4.0–10.3)
lymph#: 0.6 10*3/uL — ABNORMAL LOW (ref 0.9–3.3)

## 2014-11-08 MED ORDER — POTASSIUM CHLORIDE CRYS ER 20 MEQ PO TBCR: 20.0000 meq | EXTENDED_RELEASE_TABLET | Freq: Two times a day (BID) | ORAL | Status: DC

## 2014-11-08 NOTE — Telephone Encounter (Signed)
Pt returned call, informed him of low potassium level. Instructed him to begin potassium. He voiced understanding. Denied diarrhea. When probed, he reports 2-3 stools/ day. Stated he is taking Lactulose daily per PCP to "keep his ammonia level down."  Called Dr. Raul Del office, spoke with Olivia Mackie: She will make Dr. Brigitte Pulse aware of lab result and call pt with Lactulose instructions.

## 2014-11-08 NOTE — Telephone Encounter (Signed)
Shane Larson from Dr. Raul Del office called to report Dr. Brigitte Pulse recommends pt continue Lactulose for now, but will defer to Dr. Benay Spice. Reviewed with Dr. Benay Spice: pt to continue Lactulose, begin Potassium.  Discussed above with pt, he stated he will begin Potassium this evening.

## 2014-11-08 NOTE — Telephone Encounter (Signed)
Received CMET/ Reviewed by Dr. Benay Spice. Pt has critical potassium level: 2.2.  Order received for KCL BID for 2 days then one daily. Attempted to call pt, no answer on mobile phone. Left message requesting return call. [phone]: Left message at home number for pt to call office.

## 2014-11-09 ENCOUNTER — Encounter: Payer: Self-pay | Admitting: Oncology

## 2014-11-09 NOTE — Progress Notes (Signed)
I placed fmla forms for wife Marliss Czar on the desk of nurse for dr. Benay Spice

## 2014-11-10 ENCOUNTER — Encounter: Payer: Self-pay | Admitting: Oncology

## 2014-11-10 NOTE — Progress Notes (Signed)
I left a message for wife on her vmail to advise her fmla forms are ready for pick up at front desk with ms. Wilma on 11/12/14

## 2014-11-11 ENCOUNTER — Other Ambulatory Visit: Payer: Self-pay | Admitting: *Deleted

## 2014-11-11 DIAGNOSIS — C801 Malignant (primary) neoplasm, unspecified: Secondary | ICD-10-CM

## 2014-11-12 ENCOUNTER — Telehealth: Payer: Self-pay | Admitting: Nurse Practitioner

## 2014-11-12 ENCOUNTER — Telehealth: Payer: Self-pay | Admitting: *Deleted

## 2014-11-12 ENCOUNTER — Other Ambulatory Visit (HOSPITAL_BASED_OUTPATIENT_CLINIC_OR_DEPARTMENT_OTHER): Payer: BLUE CROSS/BLUE SHIELD

## 2014-11-12 ENCOUNTER — Ambulatory Visit (HOSPITAL_BASED_OUTPATIENT_CLINIC_OR_DEPARTMENT_OTHER): Payer: BLUE CROSS/BLUE SHIELD | Admitting: Nurse Practitioner

## 2014-11-12 VITALS — BP 161/82 | HR 53 | Temp 98.4°F | Resp 18 | Ht 74.5 in | Wt 200.8 lb

## 2014-11-12 DIAGNOSIS — C801 Malignant (primary) neoplasm, unspecified: Secondary | ICD-10-CM

## 2014-11-12 DIAGNOSIS — C7A8 Other malignant neuroendocrine tumors: Secondary | ICD-10-CM

## 2014-11-12 DIAGNOSIS — D696 Thrombocytopenia, unspecified: Secondary | ICD-10-CM | POA: Diagnosis not present

## 2014-11-12 DIAGNOSIS — E876 Hypokalemia: Secondary | ICD-10-CM

## 2014-11-12 DIAGNOSIS — C7B8 Other secondary neuroendocrine tumors: Secondary | ICD-10-CM | POA: Diagnosis not present

## 2014-11-12 LAB — BASIC METABOLIC PANEL (CC13)
ANION GAP: 11 meq/L (ref 3–11)
BUN: 10.5 mg/dL (ref 7.0–26.0)
CO2: 38 mEq/L — ABNORMAL HIGH (ref 22–29)
CREATININE: 0.7 mg/dL (ref 0.7–1.3)
Calcium: 8 mg/dL — ABNORMAL LOW (ref 8.4–10.4)
Chloride: 93 mEq/L — ABNORMAL LOW (ref 98–109)
EGFR: >90 mL/min/{1.73_m2} (ref >90)
Glucose: 250 mg/dl — ABNORMAL HIGH (ref 70–140)
Potassium: 2.8 mEq/L — CL (ref 3.5–5.1)
Sodium: 142 mEq/L (ref 136–145)

## 2014-11-12 LAB — 5 HIAA, QUANTITATIVE, URINE, 24 HOUR
5-HIAA, 24 Hr Urine: 40.2 mg/24 h — ABNORMAL HIGH (ref <=6.0)
VOLUME, URINE-5HIAA: 3000 mL/(24.h)

## 2014-11-12 MED ORDER — POTASSIUM CHLORIDE CRYS ER 20 MEQ PO TBCR: 20.0000 meq | EXTENDED_RELEASE_TABLET | Freq: Two times a day (BID) | ORAL | Status: DC

## 2014-11-12 NOTE — Telephone Encounter (Signed)
Left Message for patient to call back regarding appointments

## 2014-11-12 NOTE — Progress Notes (Signed)
  [facility] OFFICE PROGRESS NOTE   Diagnosis:   Neuroendocrine carcinoma involving the liver  INTERVAL HISTORY:   Mr. Cast returns as scheduled. He continues to note leg edema and abdominal distention. No significant abdominal pain. He has mild intermittent nausea. He is occasionally lightheaded. Bowels moving regularly. He reports a good appetite.  Objective:  Vital signs in last 24 hours:  Blood pressure 161/82, pulse 53, temperature 98.4 F (36.9 C), temperature source Oral, resp. rate 18, height 6' 2.5" (1.892 m), weight 200 lb 12.8 oz (91.082 kg), SpO2 98 %.    HEENT:  No thrush or ulcers. Resp:  Lungs clear bilaterally. Cardio:  Regular rate and rhythm. GI:  Abdomen is soft. Liver palpable right upper abdomen. Vascular:  Pitting edema throughout the legs bilaterally. Neuro:  Alert and oriented.  Skin:  No rash.    Lab Results:  Lab Results  Component Value Date   WBC 6.9 11/08/2014   HGB 14.9 11/08/2014   HCT 43.4 11/08/2014   MCV 92.3 11/08/2014   PLT 69* 11/08/2014   NEUTROABS 6.0 11/08/2014   11/08/2014 24-hour urine 5 HIAA 40.2 (reference range less than 6)  Chromogranin A level pending  Imaging:  No results found.  Medications: I have reviewed the patient's current medications.  Assessment/Plan: 1. 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 History of alcohol use 3. CT scan evidence of cirrhosis 10/22/2014 4. Thrombocytopenia 5. Hypokalemia likely secondary to diuretics.   Disposition: The 24-hour urine 5 HIAA returned elevated. The chromogranin A level is pending. Dr. Benay Spice recommends proceeding with an octreotide scan. We will try to get this scheduled early  next week.  The potassium level is better but continues to be low. He will take Kdur 20 mEq twice daily for 3 days and then once daily. We will repeat a basic metabolic panel with his next visit.  He will return for a follow-up visit on 11/18/2014. He will contact the office in the interim with any problems.  Patient seen with Dr. Benay Spice.    Ned Card ANP/GNP-BC   11/12/2014  11:50 AM  This was a shared visit with Ned Card. The 24-hour urine 5 HIAA is elevated consistent with a neuroendocrine tumor. If the chromogranin A level returns elevated he will be scheduled for an octreotide scan.  Julieanne Manson, M.D.

## 2014-11-12 NOTE — Patient Instructions (Signed)
Octreotide Scan This is a scan which is used to find tumors that are made up of nerve and gland tissue. In this test, a radioactive material is injected into the blood stream. The material injected is a very small amount which is not harmful to the patient. A scan is then done using a specialized type of camera which is similar to taking an x-ray. This will help detect carcinoid tumors, gastrinomas, insulinomas, glucagonoma, and other abnormalities and cancers. PREPARATION FOR TEST No preparation or fasting is necessary. NORMAL FINDINGS No evidence of increased uptake throughout the body. Ranges for normal findings may vary among different laboratories and hospitals. You should always check with your doctor after having lab work or other tests done to discuss the meaning of your test results and whether your values are considered within normal limits. MEANING OF TEST  Your caregiver will go over the test results with you and discuss the importance and meaning of your results, as well as treatment options and the need for additional tests if necessary. OBTAINING THE TEST RESULTS It is your responsibility to obtain your test results. Ask the lab or department performing the test when and how you will get your results. Document Released: 09/27/2004 Document Revised: 10/01/2011 Document Reviewed: 06/19/2008 [facility] Patient Information 2015 [place], [place]. This information is not intended to replace advice given to you by your health care provider. Make sure you discuss any questions you have with your health care provider.

## 2014-11-12 NOTE — Telephone Encounter (Signed)
Pt's wife called and wants to know if Dr Benay Spice would consider doing a "capsule enteroscopy". Will forward to Dr Benay Spice for review

## 2014-11-12 NOTE — Telephone Encounter (Signed)
TC from regarding upcoming appts.  Reviewed visits scheduled here @ [facility] and gave these to him . He verbalized understanding.

## 2014-11-12 NOTE — Telephone Encounter (Signed)
Pt confirmed labs/ov per 04/22 POF, gave pt AVS and Calendar.... KJ

## 2014-11-15 LAB — CHROMOGRANIN A: Chromogranin A: 8000 ng/mL — ABNORMAL HIGH (ref <=15)

## 2014-11-16 ENCOUNTER — Telehealth: Payer: Self-pay | Admitting: *Deleted

## 2014-11-16 NOTE — Telephone Encounter (Signed)
Per Dr. Benay Spice; left voice message for pt to call back re: lab results.

## 2014-11-16 NOTE — Telephone Encounter (Signed)
-----   Message from Ladell Pier, MD sent at 11/15/2014  5:06 PM EDT ----- Please call patient, Chromagrannin A is markedly elevated consistent with a carcinoid tumor,  Proceed with octreotide scan and f/u as scheduled

## 2014-11-17 ENCOUNTER — Telehealth: Payer: Self-pay | Admitting: *Deleted

## 2014-11-17 DIAGNOSIS — L4050 Arthropathic psoriasis, unspecified: Secondary | ICD-10-CM

## 2014-11-17 MED ORDER — ACETAMINOPHEN 325 MG PO TABS: 650.0000 mg | ORAL_TABLET | Freq: Four times a day (QID) | ORAL | Status: AC | PRN

## 2014-11-17 NOTE — Telephone Encounter (Addendum)
Patient having a lot of pain in his knee from psoriatic arthritis. Has a cream he has used in past and asking for confirmation it is OK to use this now? Informed him that the topical cream would not interfere with his lab/treatment. Also asking what OTC med is best for him to use for pain if needed since his liver functions are elevated? Tylenol or Ibuprofen, and what is maximum amount he can take in a day? Forwarded this question to MD. She is also asking for social worker to reach out to him to discuss his anxiety and depression to see if he would agree to counseling. Best to reach him on his cell phone and it is OK to let him know she called. Will relay request to Education officer, museum.

## 2014-11-17 NOTE — Telephone Encounter (Signed)
Dr. Benay Spice suggests to decrease the Lactulose to 30 cc daily as needed. Use OTC Tylenol for pain with maximum of 2000 mg/day. Call if this does not control his pain. Wife notified.

## 2014-11-17 NOTE — Addendum Note (Signed)
Addended by: Tania Ade on: 11/17/2014 10:13 AM   Modules accepted: Orders

## 2014-11-18 ENCOUNTER — Other Ambulatory Visit (HOSPITAL_BASED_OUTPATIENT_CLINIC_OR_DEPARTMENT_OTHER): Payer: BLUE CROSS/BLUE SHIELD

## 2014-11-18 ENCOUNTER — Ambulatory Visit: Payer: BLUE CROSS/BLUE SHIELD | Admitting: Nurse Practitioner

## 2014-11-18 DIAGNOSIS — C7A8 Other malignant neuroendocrine tumors: Secondary | ICD-10-CM | POA: Diagnosis not present

## 2014-11-18 DIAGNOSIS — C7B8 Other secondary neuroendocrine tumors: Secondary | ICD-10-CM

## 2014-11-18 LAB — BASIC METABOLIC PANEL (CC13)
ANION GAP: 13 meq/L — AB (ref 3–11)
BUN: 19.9 mg/dL (ref 7.0–26.0)
CALCIUM: 8.6 mg/dL (ref 8.4–10.4)
CHLORIDE: 92 meq/L — AB (ref 98–109)
CO2: 33 mEq/L — ABNORMAL HIGH (ref 22–29)
CREATININE: 1.3 mg/dL (ref 0.7–1.3)
EGFR: 59 mL/min/{1.73_m2} — AB (ref >90)
Glucose: 306 mg/dl — ABNORMAL HIGH (ref 70–140)
Potassium: 3.5 mEq/L (ref 3.5–5.1)
Sodium: 139 mEq/L (ref 136–145)

## 2014-11-19 ENCOUNTER — Encounter: Payer: Self-pay | Admitting: *Deleted

## 2014-11-19 ENCOUNTER — Encounter: Payer: Self-pay | Admitting: Oncology

## 2014-11-19 ENCOUNTER — Telehealth: Payer: Self-pay | Admitting: *Deleted

## 2014-11-19 NOTE — Telephone Encounter (Signed)
-----   Message from Owens Shark, NP sent at 11/18/2014  4:53 PM EDT ----- Please let him know the potassium level is better. He should continue K dur 20 mEq daily.

## 2014-11-19 NOTE — Telephone Encounter (Signed)
VM from wife that Shane Larson missed a call from the office and may be in regards to his labs. She requests to be called back on her cell. Notified Production designer, theatre/television/film.

## 2014-11-19 NOTE — Telephone Encounter (Signed)
Informed patient's wife Eulis Canner) that patient's potassium is better and he needs to continue taking Kdur 20 mEq daily.  Per Elby Showers. Marcello Moores, NP.  Patient's wife verbalized understanding.

## 2014-11-19 NOTE — Telephone Encounter (Signed)
Left message for patient to call back regarding lab results.

## 2014-11-19 NOTE — Progress Notes (Signed)
[place] Work  Clinical Social Work was referred by patient navigator for assessment of psychosocial needs due to anxiety and depression.  Clinical Social Worker contacted patient to offer support and assess for needs. CSW got his vm and left a message to call CSW.     Loren Racer, [place] Worker [place]  [place] Phone: [phone] Fax: [phone]

## 2014-11-19 NOTE — Progress Notes (Signed)
Patient wife left on my vmail about get potassium meds?? She mentioned he was about out?? I called and spoke with amy and she said it was refilled 11/12/14. Not sure why left message on my vmail.

## 2014-11-20 ENCOUNTER — Other Ambulatory Visit: Payer: Self-pay

## 2014-11-20 ENCOUNTER — Emergency Department (HOSPITAL_COMMUNITY): Payer: BLUE CROSS/BLUE SHIELD

## 2014-11-20 ENCOUNTER — Inpatient Hospital Stay (HOSPITAL_COMMUNITY)
Admission: EM | Admit: 2014-11-20 | Discharge: 2014-11-26 | DRG: 683 | Disposition: A | Discharge status: Skilled Nursing Facility | Payer: BLUE CROSS/BLUE SHIELD | Attending: Family Medicine | Admitting: Family Medicine

## 2014-11-20 ENCOUNTER — Encounter (HOSPITAL_COMMUNITY): Payer: Self-pay | Admitting: Emergency Medicine

## 2014-11-20 DIAGNOSIS — R531 Weakness: Secondary | ICD-10-CM | POA: Diagnosis present

## 2014-11-20 DIAGNOSIS — D696 Thrombocytopenia, unspecified: Secondary | ICD-10-CM | POA: Diagnosis present

## 2014-11-20 DIAGNOSIS — Z515 Encounter for palliative care: Secondary | ICD-10-CM | POA: Diagnosis not present

## 2014-11-20 DIAGNOSIS — T502X5A Adverse effect of carbonic-anhydrase inhibitors, benzothiadiazides and other diuretics, initial encounter: Secondary | ICD-10-CM | POA: Diagnosis not present

## 2014-11-20 DIAGNOSIS — N179 Acute kidney failure, unspecified: Secondary | ICD-10-CM | POA: Diagnosis present

## 2014-11-20 DIAGNOSIS — C7B02 Secondary carcinoid tumors of liver: Secondary | ICD-10-CM | POA: Diagnosis present

## 2014-11-20 DIAGNOSIS — E1165 Type 2 diabetes mellitus with hyperglycemia: Secondary | ICD-10-CM | POA: Diagnosis present

## 2014-11-20 DIAGNOSIS — Z7401 Bed confinement status: Secondary | ICD-10-CM

## 2014-11-20 DIAGNOSIS — Z886 Allergy status to analgesic agent status: Secondary | ICD-10-CM

## 2014-11-20 DIAGNOSIS — R627 Adult failure to thrive: Secondary | ICD-10-CM | POA: Diagnosis present

## 2014-11-20 DIAGNOSIS — C7A8 Other malignant neuroendocrine tumors: Secondary | ICD-10-CM | POA: Diagnosis present

## 2014-11-20 DIAGNOSIS — E876 Hypokalemia: Secondary | ICD-10-CM | POA: Diagnosis not present

## 2014-11-20 DIAGNOSIS — C801 Malignant (primary) neoplasm, unspecified: Secondary | ICD-10-CM | POA: Diagnosis not present

## 2014-11-20 DIAGNOSIS — M1990 Unspecified osteoarthritis, unspecified site: Secondary | ICD-10-CM | POA: Diagnosis present

## 2014-11-20 DIAGNOSIS — E781 Pure hyperglyceridemia: Secondary | ICD-10-CM | POA: Diagnosis present

## 2014-11-20 DIAGNOSIS — Z87891 Personal history of nicotine dependence: Secondary | ICD-10-CM | POA: Diagnosis not present

## 2014-11-20 DIAGNOSIS — N19 Unspecified kidney failure: Secondary | ICD-10-CM

## 2014-11-20 DIAGNOSIS — E860 Dehydration: Secondary | ICD-10-CM | POA: Diagnosis present

## 2014-11-20 DIAGNOSIS — C7B8 Other secondary neuroendocrine tumors: Secondary | ICD-10-CM

## 2014-11-20 DIAGNOSIS — I4891 Unspecified atrial fibrillation: Secondary | ICD-10-CM | POA: Diagnosis present

## 2014-11-20 DIAGNOSIS — R601 Generalized edema: Secondary | ICD-10-CM | POA: Diagnosis present

## 2014-11-20 DIAGNOSIS — Z86010 Personal history of colonic polyps: Secondary | ICD-10-CM | POA: Diagnosis not present

## 2014-11-20 DIAGNOSIS — E119 Type 2 diabetes mellitus without complications: Secondary | ICD-10-CM | POA: Diagnosis not present

## 2014-11-20 DIAGNOSIS — N390 Urinary tract infection, site not specified: Secondary | ICD-10-CM

## 2014-11-20 DIAGNOSIS — N3000 Acute cystitis without hematuria: Secondary | ICD-10-CM | POA: Diagnosis not present

## 2014-11-20 DIAGNOSIS — N289 Disorder of kidney and ureter, unspecified: Secondary | ICD-10-CM

## 2014-11-20 DIAGNOSIS — R160 Hepatomegaly, not elsewhere classified: Secondary | ICD-10-CM | POA: Diagnosis not present

## 2014-11-20 DIAGNOSIS — R739 Hyperglycemia, unspecified: Secondary | ICD-10-CM | POA: Diagnosis not present

## 2014-11-20 DIAGNOSIS — K7460 Unspecified cirrhosis of liver: Secondary | ICD-10-CM | POA: Diagnosis present

## 2014-11-20 LAB — URINALYSIS, ROUTINE W REFLEX MICROSCOPIC
Bilirubin Urine: NEGATIVE
Glucose, UA: NEGATIVE mg/dL
KETONES UR: NEGATIVE mg/dL
NITRITE: POSITIVE — AB
Protein, ur: 30 mg/dL — AB
Specific Gravity, Urine: 1.013 (ref 1.005–1.030)
Urobilinogen, UA: 1 mg/dL (ref 0.0–1.0)
pH: 6 (ref 5.0–8.0)

## 2014-11-20 LAB — I-STAT TROPONIN, ED: Troponin i, poc: 0.01 ng/mL (ref 0.00–0.08)

## 2014-11-20 LAB — CBC
HCT: 42.6 % (ref 39.0–52.0)
Hemoglobin: 14.5 g/dL (ref 13.0–17.0)
MCH: 31.5 pg (ref 26.0–34.0)
MCHC: 34 g/dL (ref 30.0–36.0)
MCV: 92.4 fL (ref 78.0–100.0)
PLATELETS: 16 10*3/uL — AB (ref 150–400)
RBC: 4.61 MIL/uL (ref 4.22–5.81)
RDW: 14.7 % (ref 11.5–15.5)
WBC: 10.7 10*3/uL — ABNORMAL HIGH (ref 4.0–10.5)

## 2014-11-20 LAB — COMPREHENSIVE METABOLIC PANEL
ALK PHOS: 134 U/L — AB (ref 39–117)
ALT: 32 U/L (ref 0–53)
AST: 21 U/L (ref 0–37)
Albumin: 2 g/dL — ABNORMAL LOW (ref 3.5–5.2)
Anion gap: 7 (ref 5–15)
BILIRUBIN TOTAL: 2 mg/dL — AB (ref 0.3–1.2)
BUN: 36 mg/dL — AB (ref 6–23)
CO2: 36 mmol/L — ABNORMAL HIGH (ref 19–32)
Calcium: 7.6 mg/dL — ABNORMAL LOW (ref 8.4–10.5)
Chloride: 92 mmol/L — ABNORMAL LOW (ref 96–112)
Creatinine, Ser: 1.79 mg/dL — ABNORMAL HIGH (ref 0.50–1.35)
GFR calc Af Amer: 45 mL/min — ABNORMAL LOW (ref >90)
GFR, EST NON AFRICAN AMERICAN: 39 mL/min — AB (ref >90)
GLUCOSE: 220 mg/dL — AB (ref 70–99)
POTASSIUM: 3.8 mmol/L (ref 3.5–5.1)
Sodium: 135 mmol/L (ref 135–145)
Total Protein: 5 g/dL — ABNORMAL LOW (ref 6.0–8.3)

## 2014-11-20 LAB — SEDIMENTATION RATE: Sed Rate: 77 mm/hr — ABNORMAL HIGH (ref 0–16)

## 2014-11-20 LAB — URINE MICROSCOPIC-ADD ON

## 2014-11-20 LAB — AMMONIA: AMMONIA: 19 umol/L (ref 11–32)

## 2014-11-20 LAB — TSH: TSH: 0.756 u[IU]/mL (ref 0.350–4.500)

## 2014-11-20 LAB — MAGNESIUM: Magnesium: 2.4 mg/dL (ref 1.5–2.5)

## 2014-11-20 MED ORDER — SODIUM CHLORIDE 0.9 % IV SOLN
Freq: Once | INTRAVENOUS | Status: AC
  Administered 2014-11-20: 12:00:00 via INTRAVENOUS

## 2014-11-20 MED ORDER — ONDANSETRON HCL 4 MG/2ML IJ SOLN: 4.0000 mg | Freq: Four times a day (QID) | INTRAMUSCULAR | Status: DC | PRN

## 2014-11-20 MED ORDER — DEXTROSE 5 % IV SOLN
1.0000 g | Freq: Once | INTRAVENOUS | Status: AC
  Administered 2014-11-20: 1 g via INTRAVENOUS
  Filled 2014-11-20: qty 10

## 2014-11-20 MED ORDER — ACETAMINOPHEN 325 MG PO TABS
650.0000 mg | ORAL_TABLET | Freq: Four times a day (QID) | ORAL | Status: DC | PRN
  Administered 2014-11-22 – 2014-11-26 (×8): 650 mg via ORAL
  Filled 2014-11-20 (×8): qty 2

## 2014-11-20 MED ORDER — ONDANSETRON HCL 4 MG PO TABS: 4.0000 mg | ORAL_TABLET | Freq: Four times a day (QID) | ORAL | Status: DC | PRN

## 2014-11-20 MED ORDER — ACETAMINOPHEN 325 MG PO TABS: 650.0000 mg | ORAL_TABLET | Freq: Four times a day (QID) | ORAL | Status: DC | PRN

## 2014-11-20 MED ORDER — DILTIAZEM LOAD VIA INFUSION
10.0000 mg | Freq: Once | INTRAVENOUS | Status: AC
  Administered 2014-11-20: 10 mg via INTRAVENOUS
  Filled 2014-11-20: qty 10

## 2014-11-20 MED ORDER — POTASSIUM CHLORIDE 2 MEQ/ML IV SOLN
INTRAVENOUS | Status: DC
  Administered 2014-11-20 – 2014-11-22 (×3): via INTRAVENOUS
  Filled 2014-11-20 (×6): qty 1000

## 2014-11-20 MED ORDER — SODIUM CHLORIDE 0.9 % IV SOLN
INTRAVENOUS | Status: AC
  Administered 2014-11-20: 17:00:00 via INTRAVENOUS

## 2014-11-20 MED ORDER — CEFTRIAXONE SODIUM IN DEXTROSE 20 MG/ML IV SOLN
1.0000 g | INTRAVENOUS | Status: DC
  Administered 2014-11-20 – 2014-11-24 (×5): 1 g via INTRAVENOUS
  Filled 2014-11-20 (×5): qty 50

## 2014-11-20 MED ORDER — DILTIAZEM HCL 100 MG IV SOLR
10.0000 mg/h | INTRAVENOUS | Status: DC
  Administered 2014-11-20: 10 mg/h via INTRAVENOUS
  Administered 2014-11-21: 15 mg/h via INTRAVENOUS
  Filled 2014-11-20 (×2): qty 100

## 2014-11-20 MED ORDER — PNEUMOCOCCAL VAC POLYVALENT 25 MCG/0.5ML IJ INJ
0.5000 mL | INJECTION | INTRAMUSCULAR | Status: DC
  Filled 2014-11-20 (×4): qty 0.5

## 2014-11-20 MED ORDER — ACETAMINOPHEN 650 MG RE SUPP: 650.0000 mg | Freq: Four times a day (QID) | RECTAL | Status: DC | PRN

## 2014-11-20 NOTE — ED Notes (Signed)
Pt c/o increased weakness over the past 24-72 hours. Currently being worked up for [employer] by MD [employer]. Also had past right leg injury and is now having increased right leg pain and edema. Pt has noted unsteady gait ambulating short distance from wheelchair to bed. Denies N/V/D/F. No other c/c. Wife reports some altered mental status at times. Pt A&Ox4 at this time. Moving all extremities but there is limited movement in BLE d/t swelling and pain.

## 2014-11-20 NOTE — Progress Notes (Signed)
Patient transferred to 1412. Taking over care of patient. Cardizem drip initiated. Agree with previous RN assessment. Patient denies any needs at this time. Wife updated on plan of care for patient. Will monitor closely.

## 2014-11-20 NOTE — Progress Notes (Signed)
During HS vitals, nurse tech noticed HR was ranging from low 30's to >150.  Upon apical assessment, this RN noticed patient's HR was extremely irregular (very fast beats, followed by low HR).  EKG was obtained and MD notified.  EKG showed Afib w/ RVR. MD and ICU RN notified. Transferring patient now to telemetry floor room 1412. Will notify wife. Azzie Glatter [place]

## 2014-11-20 NOTE — ED Provider Notes (Signed)
CSN: [id number]     Arrival date & time 11/20/14  1054 History   First MD Initiated Contact with Patient 11/20/14 1057     Chief Complaint  Patient presents with  . Weakness  . 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 He reports that he has been on a diuretic secondary to the peripheral edema which she has suffered over the last couple of months, he had subsequent hypokalemia which was quite severe and required supplementation and has been put on lactulose for elevated ammonia levels though he has not become encephalopathic. Over the last 2 days his wife and the patient reports that he has become more significantly fatigued, unable to ambulate without significant assistance with a walker and is very slow. Unable to climb stairs without significant assistance. He has had no fevers chills nausea vomiting coughing shortness of breath or abdominal discomfort, denies blood in the stool, has his baseline significant swelling of the lower extremities.  Patient is a 64 y.o. male presenting with weakness. The history is provided by the patient and the spouse.  Weakness    Past Medical History  Diagnosis Date  . Colon polyps     adenomatous  . Diverticulosis   . Hemorrhoids   . Psoriasis   . Arthritis     psoriatic arthritis  . Hypertriglyceridemia    Past Surgical History  Procedure Laterality Date  . Colonoscopy     Family History  Problem Relation Age of Onset  . Colon cancer Neg Hx   . Stroke Mother   . Congestive Heart Failure Mother   . COPD Father   . Skin cancer Brother   . Alzheimer's disease Maternal Grandfather    History  Substance Use Topics  . Smoking status:  Former Smoker    Quit date: 11/04/2002  . Smokeless tobacco: Never Used  . Alcohol Use: 12.6 oz/week    21 Cans of beer per week    Review of Systems  Neurological: Positive for weakness.  All other systems reviewed and are negative.     Allergies  Codeine  Home Medications   Prior to Admission medications   Medication Sig Start Date End Date Taking? Authorizing Provider  acetaminophen (TYLENOL) 325 MG tablet Take 2 tablets (650 mg total) by mouth every 6 (six) hours as needed (Maximum dose 2000 mg/day). 11/17/14  Yes Ladell Pier, MD  calcipotriene (DOVONOX) 0.005 % cream Apply 1 application topically as needed (pain psoriatic arthritis).   Yes Historical Provider, MD  furosemide (LASIX) 20 MG tablet Take 20 mg by mouth.   Yes Historical Provider, MD  lactulose (CHRONULAC) 10 GM/15ML solution Take 30 g by mouth daily.   Yes Historical Provider, MD  potassium chloride SA (K-DUR,KLOR-CON) 20 MEQ tablet Take 1 tablet (20 mEq total) by mouth 2 (two) times daily. For 3 days then one PO daily. 11/12/14  Yes Owens Shark, NP  spironolactone (ALDACTONE) 50 MG tablet Take 50 mg by mouth daily.   Yes Historical Provider, MD   BP 169/85 mmHg  Pulse 71  Temp(Src) 98.4 F (36.9 C) (Oral)  Resp 17  Ht 6' 2.5" (1.892 m)  Wt 198 lb (89.812 kg)  BMI 25.09 kg/m2  SpO2 100% Physical Exam  Constitutional: He  appears well-developed and well-nourished. No distress.  HENT:  Head: Normocephalic and atraumatic.  Mouth/Throat: Oropharynx is clear and moist. No oropharyngeal exudate.  Eyes: Conjunctivae and EOM are normal. Pupils are equal, round, and reactive to light. Right eye exhibits no discharge. Left eye exhibits no discharge. No scleral icterus.  Neck: Normal range of motion. Neck supple. No JVD present. No thyromegaly present.  Cardiovascular: Normal rate, regular rhythm, normal heart sounds and intact distal pulses.  Exam reveals no gallop and no friction rub.   No murmur  heard. Pulmonary/Chest: Effort normal and breath sounds normal. No respiratory distress. He has no wheezes. He has no rales.  Abdominal: Soft. Bowel sounds are normal. He exhibits no distension and no mass. There is no tenderness.  Musculoskeletal: Normal range of motion. He exhibits edema (bilateral pitting edema all the way to the waist) and tenderness (mild tenderness to palpation over the right medial knee, this is a chronic injury that occurred any years ago).  Lymphadenopathy:    He has no cervical adenopathy.  Neurological: He is alert. Coordination normal.  Speech is clear, movements are coordinated, strength in all 4 extremities is normal, patient appears generally slow to respond but has appropriate answers, no confusion, no dysmetria, cranial nerves III through XII intact  Skin: Skin is warm and dry. No rash noted. No erythema.  Psychiatric: He has a normal mood and affect. His behavior is normal.  Nursing note and vitals reviewed.   ED Course  Procedures (including critical care time) Labs Review Labs Reviewed  COMPREHENSIVE METABOLIC PANEL - Abnormal; Notable for the following:    Chloride 92 (*)    CO2 36 (*)    Glucose, Bld 220 (*)    BUN 36 (*)    Creatinine, Ser 1.79 (*)    Calcium 7.6 (*)    Total Protein 5.0 (*)    Albumin 2.0 (*)    Alkaline Phosphatase 134 (*)    Total Bilirubin 2.0 (*)    GFR calc non Af Amer 39 (*)    GFR calc Af Amer 45 (*)    All other components within normal limits  CBC - Abnormal; Notable for the following:    WBC 10.7 (*)    Platelets 16 (*)    All other components within normal limits  URINALYSIS, ROUTINE W REFLEX MICROSCOPIC - Abnormal; Notable for the following:    APPearance CLOUDY (*)    Hgb urine dipstick LARGE (*)    Protein, ur 30 (*)    Nitrite POSITIVE (*)    Leukocytes, UA SMALL (*)    All other components within normal limits  URINE MICROSCOPIC-ADD ON - Abnormal; Notable for the following:    Squamous Epithelial /  LPF FEW (*)    Bacteria, UA MANY (*)    All other components within normal limits  AMMONIA  MAGNESIUM  I-STAT TROPOININ, ED    Imaging Review Dg Chest Port 1 View  11/20/2014   CLINICAL DATA:  Weakness 1 the 3 days, unsteady gait, altered mental status, former smoker  EXAM: PORTABLE CHEST - 1 VIEW  COMPARISON:  None.  FINDINGS: The heart size and mediastinal contours are within normal limits. Both lungs are clear. The visualized skeletal structures are unremarkable.  IMPRESSION: No active disease.   Electronically Signed   By: Skipper Cliche M.D.   On: 11/20/2014 15:03    ED ECG REPORT  I personally interpreted this EKG   Date: 11/20/2014   Rate: 62  Rhythm:  normal sinus rhythm  QRS Axis: normal  Intervals: normal  ST/T Wave abnormalities: normal  Conduction Disutrbances:none  Narrative Interpretation:   Old EKG Reviewed: none available   MDM   Final diagnoses:  Weakness  UTI (lower urinary tract infection)  Renal insufficiency    The patient has vital signs consistent with mild hypertension otherwise he appears generally weak but nonfocal, there has been no imaging of the brain, there has been significant imaging of the abdomen and pelvis and chest with only liver localized metastatic lesions, there is an octreotide scan which has been ordered by oncology, check electrolytes, magnesium, urine, ammonia.  Pt has UTI on labs, renal insufficiency - VS remain normal - d/w Dr. Carles Collet who will admit as the pt is significantly weak - globally.  Meds given in ED:  Medications  cefTRIAXone (ROCEPHIN) 1 g in dextrose 5 % 50 mL IVPB (1 g Intravenous New Bag/Given 11/20/14 1543)  0.9 %  sodium chloride infusion ( Intravenous New Bag/Given 11/20/14 1228)      Noemi Chapel, MD 11/20/14 1550

## 2014-11-20 NOTE — H&P (Signed)
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 The patient has been feeling weak for the last several weeks and has progressively worsened. The patient was started on lactulose approximately 3 weeks ago for elevated ammonia levels and confusion, and he has 1-2 loose bowel movements per day. In addition, the patient has been on diuretic therapy with furosemide 20 mg daily and Aldactone 50 mg daily that was started on the last week of March 2016. This all started back in March 2016 when the patient had a workup for lower extremity edema. Abdominal ultrasound on 10/13/2014 revealed a heterogenous liver echotexture. Multiple hyperechoic lesions are noted throughout the liver. Subsequent CT of abdomen and pelvis on 10/22/2014.  revealed innumerable hypoenhancing lesions throughout the liver. He underwent an ultrasound-guided biopsy of a left liver lesion on 10/26/2014. The pathology [id number]) confirmed a neuroendocrine neoplasm.  he has had a 24-hour urine for.25-HIAA which was elevated.  Patient denies fevers, chills, headache, chest pain, dyspnea, nausea, vomiting, diarrhea, abdominal pain, dysuria, hematuria hematemesis, hematochezia, melena.  and emergency department, the patient was noted to have some creatinine 1.79. The PVC was 10.7. Platelets 16,000. Hepatic panel was negative.    Assessment/Plan: generalize weakness  -Secondary to acute kidney injury/dehydration  -Am cortisol  -TSH  -Ammonia 19  -Magnesium 2.4  -EKG--sinus rhythm negative for ST-T wave changes  -Chest x-ray is negative for edema or consolidations  -PT eval Acute kidney injury  -Baseline creatinine around 0.7    -Continue IV fluids  Thrombocytopenia -This has been chronic,but this is lower than his usual baseline  -Continue to trend  -Check coags  -Sedimentation rate  -Serum B12  Bacteriuria  -The patient does not have significant pyuria, nor is he symptomatic -However in the setting of the patient's generalized weakness-->continue ceftriaxone  - if the patient's urine culture has no growth, I will discontinue antibiotics altogether 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 Carmin Richmond on his consult list Hyperglycemia -check A1c    Past Medical History  Diagnosis Date  . Colon polyps     adenomatous  . Diverticulosis   . Hemorrhoids   . Psoriasis   . Arthritis     psoriatic arthritis  . Hypertriglyceridemia    Past Surgical History  Procedure Laterality Date  . Colonoscopy     Social History:  reports that he quit smoking about 12 years ago. He has never used smokeless tobacco. He reports that he drinks about 12.6 oz of alcohol per week. He reports that he does not use illicit drugs.   Family History  Problem Relation Age of Onset  . Colon cancer Neg Hx   . Stroke Mother   . Congestive Heart Failure Mother   . COPD Father   . Skin cancer Brother   . Alzheimer's disease Maternal Grandfather      Allergies  Allergen Reactions  . Codeine Itching      Prior to Admission medications   Medication Sig Start Date End Date Taking? Authorizing Provider  acetaminophen (TYLENOL) 325 MG tablet Take 2 tablets (650 mg total) by mouth every 6 (six) hours as needed (Maximum dose 2000 mg/day). 11/17/14  Yes Ladell Pier, MD  calcipotriene (DOVONOX) 0.005 %  cream Apply 1 application topically as needed (pain psoriatic arthritis).   Yes Historical Provider, MD  furosemide (LASIX) 20 MG tablet Take 20 mg by mouth.   Yes Historical Provider, MD  lactulose (CHRONULAC) 10 GM/15ML solution Take 30 g by mouth daily.   Yes Historical Provider, MD  potassium chloride SA  (K-DUR,KLOR-CON) 20 MEQ tablet Take 1 tablet (20 mEq total) by mouth 2 (two) times daily. For 3 days then one PO daily. 11/12/14  Yes Owens Shark, NP  spironolactone (ALDACTONE) 50 MG tablet Take 50 mg by mouth daily.   Yes Historical Provider, MD    Review of Systems:  Constitutional:  No weight loss, night sweats Head&Eyes: No headache.  No vision loss.  No eye pain or scotoma ENT:  No Difficulty swallowing,Tooth/dental problems,Sore throat,   Cardio-vascular:  No chest pain, Orthopnea, PND, swelling in lower extremities,  dizziness,  GI:  No  abdominal pain, nausea, vomiting, diarrhea, loss of appetite, hematochezia, melena, heartburn, indigestion, Resp:  No shortness of breath with exertion or at rest. No cough. No coughing up of blood .No wheezing. Skin:  no rash or lesions.  GU:  no dysuria, change in color of urine, no urgency or frequency. No flank pain.  Musculoskeletal:  No joint pain or swelling. No decreased range of motion. No back pain.  Psych:  No change in mood or affect.  Neurologic: No headache, no dysesthesia, no focal weakness, no vision loss. No syncope  Physical Exam: Filed Vitals:   11/20/14 1101  BP: 169/85  Pulse: 71  Temp: 98.4 F (36.9 C)  TempSrc: Oral  Resp: 17  Height: 6' 2.5" (1.892 m)  Weight: 89.812 kg (198 lb)  SpO2: 100%   General:  A&O x 3, NAD, nontoxic, pleasant/cooperative Head/Eye: No conjunctival hemorrhage, no icterus, [place]/AT, No nystagmus ENT:  No icterus,  No thrush, good dentition, no pharyngeal exudate Neck:  No masses, no lymphadenpathy, no bruits CV:  RRR, no rub, no gallop, no S3 Lung:  CTAB, good air movement, no wheeze, no rhonchi Abdomen: soft/NT, +BS, nondistended, no peritoneal signs Ext: No cyanosis, No rashes, No petechiae, No lymphangitis, 2+ edema Neuro: CNII-XII intact, strength 3-/5 in bilateral upper and lower extremities, no dysmetria  Labs on Admission:  Basic Metabolic Panel:  Recent Labs Lab  11/18/14 1407 11/20/14 1217  NA 139 135  K 3.5 3.8  CL  --  92*  CO2 33* 36*  GLUCOSE 306* 220*  BUN 19.9 36*  CREATININE 1.3 1.79*  CALCIUM 8.6 7.6*  MG  --  2.4   Liver Function Tests:  Recent Labs Lab 11/20/14 1217  AST 21  ALT 32  ALKPHOS 134*  BILITOT 2.0*  PROT 5.0*  ALBUMIN 2.0*   No results for input(s): LIPASE, AMYLASE in the last 168 hours.  Recent Labs Lab 11/20/14 1217  AMMONIA 19   CBC:  Recent Labs Lab 11/20/14 1217  WBC 10.7*  HGB 14.5  HCT 42.6  MCV 92.4  PLT 16*   Cardiac Enzymes: No results for input(s): CKTOTAL, CKMB, CKMBINDEX, TROPONINI in the last 168 hours. BNP: Invalid input(s): POCBNP CBG: No results for input(s): GLUCAP in the last 168 hours.  Radiological Exams on Admission: Dg Chest Port 1 View  11/20/2014   CLINICAL DATA:  Weakness 1 the 3 days, unsteady gait, altered mental status, former smoker  EXAM: PORTABLE CHEST - 1 VIEW  COMPARISON:  None.  FINDINGS: The heart size and mediastinal contours are within normal limits. Both  lungs are clear. The visualized skeletal structures are unremarkable.  IMPRESSION: No active disease.   Electronically Signed   By: Skipper Cliche M.D.   On: 11/20/2014 15:03    EKG: Independently reviewed.Sinus rhythm, no ST-T wave changes    Time spent:60 minutes Code Status:   FULL Family Communication:   Wife updated at bedside   Kenzel Ruesch, DO  [facility] Pager [phone]  If 7PM-7AM, please contact night-coverage [email or web address] Password [facility] 11/20/2014, 4:27 PM

## 2014-11-21 ENCOUNTER — Inpatient Hospital Stay (HOSPITAL_COMMUNITY): Payer: BLUE CROSS/BLUE SHIELD

## 2014-11-21 LAB — BASIC METABOLIC PANEL
Anion gap: 8 (ref 5–15)
BUN: 39 mg/dL — ABNORMAL HIGH (ref 6–20)
CO2: 32 mmol/L (ref 22–32)
Calcium: 7.2 mg/dL — ABNORMAL LOW (ref 8.9–10.3)
Chloride: 96 mmol/L — ABNORMAL LOW (ref 101–111)
Creatinine, Ser: 1.77 mg/dL — ABNORMAL HIGH (ref 0.61–1.24)
GFR, EST AFRICAN AMERICAN: 46 mL/min — AB (ref >60)
GFR, EST NON AFRICAN AMERICAN: 39 mL/min — AB (ref >60)
GLUCOSE: 322 mg/dL — AB (ref 70–99)
POTASSIUM: 3.8 mmol/L (ref 3.5–5.1)
Sodium: 136 mmol/L (ref 135–145)

## 2014-11-21 LAB — DIC (DISSEMINATED INTRAVASCULAR COAGULATION)PANEL
Platelets: 20 10*3/uL — CL (ref 150–400)
Smear Review: NONE SEEN
aPTT: 21 seconds — ABNORMAL LOW (ref 24–37)

## 2014-11-21 LAB — DIC (DISSEMINATED INTRAVASCULAR COAGULATION) PANEL
D DIMER QUANT: 2.47 ug{FEU}/mL — AB (ref 0.00–0.48)
INR: 0.98 (ref 0.00–1.49)
Prothrombin Time: 13.1 seconds (ref 11.6–15.2)

## 2014-11-21 LAB — CBC
HEMATOCRIT: 37.8 % — AB (ref 39.0–52.0)
Hemoglobin: 12.9 g/dL — ABNORMAL LOW (ref 13.0–17.0)
MCH: 31.5 pg (ref 26.0–34.0)
MCHC: 34.1 g/dL (ref 30.0–36.0)
MCV: 92.2 fL (ref 78.0–100.0)
Platelets: 17 10*3/uL — CL (ref 150–400)
RBC: 4.1 MIL/uL — ABNORMAL LOW (ref 4.22–5.81)
RDW: 15.2 % (ref 11.5–15.5)
WBC: 10.6 10*3/uL — ABNORMAL HIGH (ref 4.0–10.5)

## 2014-11-21 LAB — PROTIME-INR
INR: 1.04 (ref 0.00–1.49)
Prothrombin Time: 13.7 seconds (ref 11.6–15.2)

## 2014-11-21 LAB — SAVE SMEAR

## 2014-11-21 LAB — CORTISOL-AM, BLOOD: CORTISOL - AM: 100 ug/dL — AB (ref 6.7–22.6)

## 2014-11-21 LAB — APTT: APTT: 23 s — AB (ref 24–37)

## 2014-11-21 LAB — VITAMIN B12: VITAMIN B 12: 1177 pg/mL

## 2014-11-21 MED ORDER — DILTIAZEM HCL 30 MG PO TABS
30.0000 mg | ORAL_TABLET | Freq: Four times a day (QID) | ORAL | Status: DC
  Administered 2014-11-21 – 2014-11-26 (×21): 30 mg via ORAL
  Filled 2014-11-21 (×18): qty 1

## 2014-11-21 NOTE — Progress Notes (Signed)
[facility] PROGRESS NOTE   Cormick Moss XHB:[id number] DOB: July 13, 1951 DOA: 11/20/2014 PCP: Marton Redwood, MD  HPI/Subjective: Seen with wife at bedside, has massive lower extremity pitting edema up to his waist. Still complaining about generalized weakness and loss of energy.  Assessment/Plan: Active Problems:   UTI (urinary tract infection)   AKI (acute kidney injury)   Hyperglycemia   Generalized weakness   generalize weakness  -Secondary to acute kidney injury/dehydration  -Am cortisol  -TSH  -Ammonia 19  -Magnesium 2.4  -EKG--sinus rhythm negative for ST-T wave changes  -Chest x-ray is negative for edema or consolidations  -PT eval  Atrial fibrillation -Developed atrial fibrillation overnight, placed on Cardizem infusion. -Converted to normal sinus rhythm, will place him on Cardizem. -CHA2DS2-VASc of 1 because of diabetes, suggest and platelets, aspirin when he can take it.  Acute kidney injury  -Baseline creatinine around 0.7  -Patient has third spacing am not sure IV fluids will help that much, obtain renal ultrasound.  Thrombocytopenia -This has been chronic,but this is lower than his usual baseline  -Follow the trend, check DIC panel and peripheral smear.  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 Carmin Richmond on his consult list  Hyperglycemia -check A1c  Code Status: Full Code Family Communication: Plan discussed with the patient. Disposition Plan: Remains inpatient Diet:    Consultants:  None  Procedures:  None  Antibiotics:  None   Objective: Filed Vitals:   11/21/14 0427  BP: 121/71  Pulse: 65  Temp: 98.2 F (36.8 C)  Resp: 18     Intake/Output Summary (Last 24 hours) at 11/21/14 1254 Last data filed at 11/21/14 0700  Gross per 24 hour  Intake 1807.16 ml  Output    400 ml  Net 1407.16 ml   Filed Weights   11/20/14 1101 11/20/14 1645 11/20/14 2139  Weight: 89.812 kg (198 lb) 89.812 kg (198 lb) 89.4 kg (197 lb 1.5 oz)    Exam: General: Alert and awake, oriented x3, not in any acute distress. HEENT: anicteric sclera, pupils reactive to light and accommodation, EOMI CVS: S1-S2 clear, no murmur rubs or gallops Chest: clear to auscultation bilaterally, no wheezing, rales or rhonchi Abdomen: soft nontender, nondistended, normal bowel sounds, no organomegaly Extremities: +3 lateral pitting edema Neuro: Cranial nerves II-XII intact, no focal neurological deficits  Data Reviewed: Basic Metabolic Panel:  Recent Labs Lab 11/18/14 1407 11/20/14 1217 11/21/14 0531  NA 139 135 136  K 3.5 3.8 3.8  CL  --  92* 96*  CO2 33* 36* 32  GLUCOSE 306* 220* 322*  BUN 19.9 36* 39*  CREATININE 1.3 1.79* 1.77*  CALCIUM 8.6 7.6* 7.2*  MG  --  2.4  --    Liver Function Tests:  Recent Labs Lab 11/20/14 1217  AST 21  ALT 32  ALKPHOS 134*  BILITOT 2.0*  PROT 5.0*  ALBUMIN 2.0*   No results for input(s): LIPASE, AMYLASE in the last 168 hours.  Recent Labs Lab 11/20/14 1217  AMMONIA 19   CBC:  Recent Labs Lab 11/20/14 1217 11/21/14 0531  WBC 10.7* 10.6*  HGB 14.5 12.9*  HCT 42.6 37.8*  MCV 92.4 92.2  PLT 16* 17*   Cardiac Enzymes: No results for input(s): CKTOTAL, CKMB, CKMBINDEX, TROPONINI in the last 168 hours.  BNP (last 3 results) No results for input(s): BNP in the last 8760 hours.  ProBNP (last 3 results) No results for input(s): PROBNP in the last 8760 hours.  CBG: No results for input(s): GLUCAP in the last 168 hours.  Micro No results found for this or any previous visit (from the past 240 hour(s)).   Studies: Dg Chest Port 1 View  11/20/2014   CLINICAL DATA:  Weakness 1 the 3  days, unsteady gait, altered mental status, former smoker  EXAM: PORTABLE CHEST - 1 VIEW  COMPARISON:  None.  FINDINGS: The heart size and mediastinal contours are within normal limits. Both lungs are clear. The visualized skeletal structures are unremarkable.  IMPRESSION: No active disease.   Electronically Signed   By: Skipper Cliche M.D.   On: 11/20/2014 15:03    Scheduled Meds: . cefTRIAXone (ROCEPHIN)  IV  1 g Intravenous Q24H  . pneumococcal 23 valent vaccine  0.5 mL Intramuscular Tomorrow-1000   Continuous Infusions: . diltiazem (CARDIZEM) infusion 5 mg/hr (11/21/14 0627)  . sodium chloride 0.9 % 1,000 mL with potassium chloride 20 mEq infusion 75 mL/hr at 11/20/14 1804       Time spent: 35 minutes    [facility]  [facility] Pager [phone] If 7PM-7AM, please contact night-coverage at [email or web address], password [facility] 11/21/2014, 12:54 PM  LOS: 1 day

## 2014-11-21 NOTE — Evaluation (Signed)
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 The patient has been feeling weak for the last several weeks and has progressively worsened.  Clinical Impression  Pt admitted with above diagnosis. Pt currently with functional limitations due to the deficits listed below (see PT Problem List).  Pt will benefit from skilled PT to increase their independence and safety with mobility to allow discharge to the venue listed below.  Pt with multiple steps to enter home and to get to bedroom.  Discussed with pt current level of function and weakness and feel pt would benefit from rehab after d/c from acute care and he was in agreement.  Recommend SNF at this time, as don't feel pt could tolerate intensity of CIR.  Pt and wife educated on having him sit EOB for short periods of time to work on endurance as he moved well to EOB, but refused to get in recliner.  He did take a few shuffeling side steps, but due to edema and weakness unable to clear floor.     Follow Up Recommendations SNF    Equipment Recommendations  Rolling walker with 5" wheels    Recommendations for Other Services OT consult     Precautions / Restrictions Precautions Precautions: Fall Restrictions Weight Bearing Restrictions: No      Mobility  Bed Mobility Overal bed mobility: Needs Assistance Bed Mobility: Supine to Sit     Supine to sit: Min guard;HOB elevated     General bed mobility comments: Pt did well transferring to side of bed with min/guard.  Transfers Overall transfer level: Needs assistance Equipment used: Rolling walker (2 wheeled) Transfers: Sit to/from Stand Sit to Stand: +2 safety/equipment;Min assist;+2  physical assistance         General transfer comment: Pt required much encouragement to even attempt standing with therapist.  He stood and took a few shuffeling side steps to EOB.  Refused to get in recliner multiple times.  Ambulation/Gait Ambulation/Gait assistance: Min assist;+2 physical assistance;+2 safety/equipment           General Gait Details: shuffeling side steps towards HOB only  Stairs            Wheelchair Mobility    Modified Rankin (Stroke Patients Only)       Balance Overall balance assessment: Needs assistance   Sitting balance-Leahy Scale: Good Sitting balance - Comments: good static, fair dynamic     Standing balance-Leahy Scale: Poor Standing balance comment: requires RW                             Pertinent Vitals/Pain Pain Assessment: 0-10 Pain Score: 3  Pain Location: R LE Pain Descriptors / Indicators: Aching;Sore;Grimacing;Guarding    Home Living Family/patient expects to be discharged to:: Private residence Living Arrangements: Spouse/significant other   Type of Home: House Home Access: Stairs to enter Entrance Stairs-Rails: Can reach both;Left;Right Entrance Stairs-Number of Steps: 8 Home Layout: Two level Home Equipment: Environmental consultant - 2 wheels;Other (comment) Additional Comments: Elevated commode seat    Prior Function Level of Independence: Independent         Comments: until ~ week ago and has had progressive weakness until he could no longer stand up  and wife brought to hospital     Hand Dominance   Dominant Hand: Left    Extremity/Trunk Assessment               Lower Extremity Assessment: RLE deficits/detail;LLE deficits/detail RLE Deficits / Details: severe pitting edema. C/o R medial knee pain from an old injury LLE Deficits / Details: severe pitting edema and denies pain, but grimacing with heel slides.  When asked about it, he states " I can do it without grimacing"     Communication    Communication: No difficulties  Cognition Arousal/Alertness: Awake/alert Behavior During Therapy: WFL for tasks assessed/performed Overall Cognitive Status: Impaired/Different from baseline Area of Impairment: Problem solving             Problem Solving: Slow processing General Comments: Pt a little slow with processing and seems just a bit "off" . Wife reports noticing the same thing.    General Comments General comments (skin integrity, edema, etc.): Pt anxious about working with PT and needed encouragement.  Wife present after session and discussed status.    Exercises General Exercises - Lower Extremity Ankle Circles/Pumps: AROM;Both;10 reps;Supine      Assessment/Plan    PT Assessment Patient needs continued PT services  PT Diagnosis Difficulty walking;Generalized weakness   PT Problem List Decreased activity tolerance;Decreased balance;Decreased mobility;Decreased strength  PT Treatment Interventions DME instruction;Gait training;Functional mobility training;Therapeutic activities;Therapeutic exercise;Patient/family education;Balance training   PT Goals (Current goals can be found in the Care Plan section) Acute Rehab PT Goals Patient Stated Goal: To get stronger. PT Goal Formulation: With patient/family Time For Goal Achievement: 12/05/14 Potential to Achieve Goals: Good    Frequency Min 3X/week   Barriers to discharge        Co-evaluation               End of Session Equipment Utilized During Treatment: Gait belt Activity Tolerance: Patient limited by fatigue;Patient limited by pain Patient left: in bed;with nursing/sitter in room;with call bell/phone within reach;Other (comment) (lab in to get blood) Nurse Communication: Mobility status         Time: 0117-0140 PT Time Calculation (min) (ACUTE ONLY): 23 min   Charges:   PT Evaluation $Initial PT Evaluation Tier I: 1 Procedure PT Treatments $Therapeutic Activity: 8-22 mins   PT G Codes:         Shakyla Nolley LUBECK 11/21/2014, 3:19 PM

## 2014-11-22 ENCOUNTER — Encounter: Payer: Self-pay | Admitting: Oncology

## 2014-11-22 DIAGNOSIS — D696 Thrombocytopenia, unspecified: Secondary | ICD-10-CM

## 2014-11-22 DIAGNOSIS — C7A8 Other malignant neuroendocrine tumors: Secondary | ICD-10-CM

## 2014-11-22 DIAGNOSIS — I4891 Unspecified atrial fibrillation: Secondary | ICD-10-CM

## 2014-11-22 DIAGNOSIS — R601 Generalized edema: Secondary | ICD-10-CM

## 2014-11-22 DIAGNOSIS — N3000 Acute cystitis without hematuria: Secondary | ICD-10-CM

## 2014-11-22 DIAGNOSIS — N289 Disorder of kidney and ureter, unspecified: Secondary | ICD-10-CM

## 2014-11-22 DIAGNOSIS — C7B8 Other secondary neuroendocrine tumors: Secondary | ICD-10-CM

## 2014-11-22 DIAGNOSIS — E876 Hypokalemia: Secondary | ICD-10-CM

## 2014-11-22 DIAGNOSIS — E8809 Other disorders of plasma-protein metabolism, not elsewhere classified: Secondary | ICD-10-CM

## 2014-11-22 LAB — COMPREHENSIVE METABOLIC PANEL
ALT: 30 U/L (ref 17–63)
AST: 20 U/L (ref 15–41)
Albumin: 1.5 g/dL — ABNORMAL LOW (ref 3.5–5.0)
Alkaline Phosphatase: 138 U/L — ABNORMAL HIGH (ref 38–126)
Anion gap: 8 (ref 5–15)
BUN: 41 mg/dL — ABNORMAL HIGH (ref 6–20)
CALCIUM: 7.5 mg/dL — AB (ref 8.9–10.3)
CHLORIDE: 97 mmol/L — AB (ref 101–111)
CO2: 31 mmol/L (ref 22–32)
CREATININE: 1.61 mg/dL — AB (ref 0.61–1.24)
GFR calc Af Amer: 51 mL/min — ABNORMAL LOW (ref >60)
GFR calc non Af Amer: 44 mL/min — ABNORMAL LOW (ref >60)
GLUCOSE: 319 mg/dL — AB (ref 70–99)
Potassium: 3.8 mmol/L (ref 3.5–5.1)
SODIUM: 136 mmol/L (ref 135–145)
Total Bilirubin: 0.9 mg/dL (ref 0.3–1.2)
Total Protein: 4.3 g/dL — ABNORMAL LOW (ref 6.5–8.1)

## 2014-11-22 LAB — CBC
HEMATOCRIT: 36.5 % — AB (ref 39.0–52.0)
HEMOGLOBIN: 12.6 g/dL — AB (ref 13.0–17.0)
MCH: 32 pg (ref 26.0–34.0)
MCHC: 34.5 g/dL (ref 30.0–36.0)
MCV: 92.6 fL (ref 78.0–100.0)
PLATELETS: 23 10*3/uL — AB (ref 150–400)
RBC: 3.94 MIL/uL — AB (ref 4.22–5.81)
RDW: 15 % (ref 11.5–15.5)
WBC: 9.8 10*3/uL (ref 4.0–10.5)

## 2014-11-22 LAB — HEMOGLOBIN A1C
Hgb A1c MFr Bld: 7.3 % — ABNORMAL HIGH (ref 4.8–5.6)
Mean Plasma Glucose: 163 mg/dL

## 2014-11-22 MED ORDER — OXYCODONE HCL 5 MG PO TABS
5.0000 mg | ORAL_TABLET | ORAL | Status: DC | PRN
  Filled 2014-11-22 (×2): qty 1

## 2014-11-22 NOTE — Clinical Social Work Placement (Signed)
   CLINICAL SOCIAL WORK PLACEMENT  NOTE  Date:  11/22/2014  Patient Details  Name: Shane Larson MRN: [id number] Date of Birth: Mar 22, 1951  Clinical Social Work is seeking post-discharge placement for this patient at the [place] level of care (*CSW will initial, date and re-position this form in  chart as items are completed):  Yes   Patient/family provided with [facility] Work Department's list of facilities offering this level of care within the geographic area requested by the patient (or if unable, by the patient's family).  Yes   Patient/family informed of their freedom to choose among providers that offer the needed level of care, that participate in Medicare, Medicaid or managed care program needed by the patient, have an available bed and are willing to accept the patient.  Yes   Patient/family informed of [facility]'s ownership interest in [facility] and [facility], as well as of the fact that they are under no obligation to receive care at these facilities.  PASRR submitted to EDS on 11/22/14     PASRR number received on 11/22/14     Existing PASRR number confirmed on       FL2 transmitted to all facilities in geographic area requested by pt/family on 11/22/14     FL2 transmitted to all facilities within larger geographic area on       Patient informed that his/her managed care company has contracts with or will negotiate with certain facilities, including the following:        Yes   Patient/family informed of bed offers received.  Patient chooses bed at       Physician recommends and patient chooses bed at      Patient to be transferred to   on  .  Patient to be transferred to facility by       Patient family notified on   of transfer.  Name of family member notified:        PHYSICIAN       Additional Comment:    _______________________________________________ Standley Brooking, LCSW 11/22/2014, 2:55 PM

## 2014-11-22 NOTE — Care Management Note (Signed)
Case Management Note  Patient Details  Name: Shane Larson MRN: [id number] Date of Birth: 01/07/1951  Subjective/Objective:   64 y/o m admitted w/UTI.                 Action/Plan:From home.  Expected Discharge Date:  11/23/14               Expected Discharge Plan: SNF  In-House Referral:     Discharge planning Services  CM Consult  Post Acute Care Choice:    Choice offered to:     DME Arranged:    DME Agency:     HH Arranged:    HH Agency:     Status of Service:  In process, will continue to follow  Medicare Important Message Given:    Date Medicare IM Given:    Medicare IM give by:    Date Additional Medicare IM Given:    Additional Medicare Important Message give by:     If discussed at [place] of Stay Meetings, dates discussed:    Additional Comments:  Dessa Phi, RN 11/22/2014, 1:20 PM

## 2014-11-22 NOTE — Progress Notes (Signed)
IP PROGRESS NOTE  Subjective:   Shane Larson is known to me from a recent diagnosis of metastatic neuroendocrine carcinoma. He was admitted 11/20/2014 with progressive generalized weakness. He complains of pain at the right knee. He feels better this evening. He has anorexia. No bleeding. No recent alcohol use.   Objective: Vital signs in last 24 hours: Blood pressure 152/88, pulse 67, temperature 98.2 F (36.8 C), temperature source Oral, resp. rate 18, height 6' 2.5" (1.892 m), weight 197 lb 1.5 oz (89.4 kg), SpO2 98 %.  Intake/Output from previous day: 05/01 0701 - 05/02 0700 In: 2669.1 [P.O.:840; I.V.:1779.1; IV Piggyback:50] Out: 1050 [Urine:1050]  Physical Exam:  HEENT: No thrush or bleeding Lungs: Decreased breath sounds at the left lower chest, no respiratory distress Cardiac: Irregular Abdomen: The liver is palpable in the right upper abdomen, no spinal megaly, no apparent ascites Extremities: Anasarca Musculoskeletal: Mild effusion at the right knee with tenderness at the right medial tibia plateau  Lab Results:  Recent Labs  11/21/14 0531 11/21/14 1352 11/22/14 0605  WBC 10.6*  --  9.8  HGB 12.9*  --  12.6*  HCT 37.8*  --  36.5*  PLT 17* 20* 23*   Blood smear: The platelets are decreased in number, some the platelets are large. No platelet clumps. No schistocytes. The polychromasia is not increased. The majority the white cells are mature neutrophils and bands forms. No nucleated red cells.   BMET  Recent Labs  11/21/14 0531 11/22/14 0605  NA 136 136  K 3.8 3.8  CL 96* 97*  CO2 32 31  GLUCOSE 322* 319*  BUN 39* 41*  CREATININE 1.77* 1.61*  CALCIUM 7.2* 7.5*    Studies/Results: US Renal  11/21/2014   CLINICAL DATA:  Renal failure.  Elevated creatinine and BUN.  EXAM: RENAL / URINARY TRACT ULTRASOUND COMPLETE  COMPARISON:  CT of the abdomen and pelvis on 10/22/2014  FINDINGS: Right Kidney:  Length: 13.7 cm. Echogenicity within normal limits. No mass or  hydronephrosis visualized.  Left Kidney:  Length: 13.7 cm. Echogenicity within normal limits. No mass or hydronephrosis visualized.  Bladder:  Appears normal for degree of bladder distention.  Ascites and left pleural effusion are noted. Multiple hypoechoic liver lesions are noted.  IMPRESSION: 1. No hydronephrosis or focal renal mass. 2. Ascites, left pleural effusion. 3. Multiple liver masses.   Electronically Signed   By: Nolon Nations M.D.   On: 11/21/2014 14:53    Medications: I have reviewed the patient's current medications.  Assessment/Plan:  1. 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 History of alcohol use 3. CT scan evidence of cirrhosis 10/22/2014 4. Thrombocytopenia 5. Hypokalemia likely secondary to diuretics. 6. Anasarca secondary to hypoalbuminemia 7. Renal insufficiency 8. 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 The primary tumor site is unknown. His performance status has declined significantly over the past 2 weeks. This is likely secondary to progression of the metastatic carcinoma. He may have underlying cirrhosis. He has severe hypoalbuminemia and renal insufficiency.  He has developed severe thrombocytopenia. He does not appear to have DIC. The thrombocytopenia may be related to liver dysfunction, and autoimmune etiology, or less likely tumor involving the bone marrow. The  peripheral blood smear does not suggest a myelophthisic process. A urinary tract infection may be contributing.  I discussed the difficult situation with Shane Larson and his wife. We had planned to consider systemic treatment options, but he may not  be a candidate for therapy with his poor performance status and multiorgan dysfunction.  Recommendations: 1. Add oxycodone to use as needed for pain 2. Proceed with octreotide scan as an inpatient 3. I will discuss his case with colleagues over the next one to 2 days and decide on a trial of systemic therapy versus Hospice care.    LOS: 2 days   Daysia Vandenboom, Dominica Severin  11/22/2014, 4:26 PM

## 2014-11-22 NOTE — Progress Notes (Signed)
Inpatient Diabetes Program Recommendations  AACE/ADA: New Consensus Statement on Inpatient Glycemic Control (2013)  Target Ranges:  Prepandial:   less than 140 mg/dL      Peak postprandial:   less than 180 mg/dL (1-2 hours)      Critically ill patients:  140 - 180 mg/dL     Results for Shane Larson, Shane Larson (MRN [id number]) as of 11/22/2014 10:03  Ref. Range 11/20/2014 12:17 11/21/2014 05:31 11/22/2014 06:05  Glucose Latest Ref Range: 70-99 mg/dL 220 (H) 322 (H) 319 (H)     Admit with: Weakness/ Acute Kidney Injury  History: Neuroendocrine Neoplasm    **Note lab glucose levels elevated for several days now.  **A1c pending.    MD- Please consider placing order for CBG checks tid ac + HS and cover with Novolog Sensitive SSI if elevated     Will follow Wyn Quaker RN, MSN, CDE Diabetes Coordinator Inpatient Diabetes Program Team Pager: [phone] (8a-5p)

## 2014-11-22 NOTE — Clinical Social Work Note (Signed)
Clinical Social Work Assessment  Patient Details  Name: Shane Larson MRN: [id number] Date of Birth: 1951/02/21  Date of referral:  11/22/14               Reason for consult:  Facility Placement                Permission sought to share information with:  Facility Sport and exercise psychologist, Family Supports Permission granted to share information::  Yes, Verbal Permission Granted  Name::        Agency::     Relationship::     Contact Information:     Housing/Transportation Living arrangements for the past 2 months:  [place] of Information:  Patient, Spouse Patient Interpreter Needed:  None Criminal Activity/Legal Involvement Pertinent to Current Situation/Hospitalization:    Significant Relationships:  Spouse, Adult Children Lives with:    Do you feel safe going back to the place where you live?  No Need for family participation in patient care:  Yes (Comment)  Care giving concerns:  CSW received consult for SNF placement, reviewed PT evaluation recommending SNF as well.    Social Worker assessment / plan:  CSW spoke with patient & wife, Shane Larson at bedside re: discharge planning. Patient is agreeable with plan for SNF at discharge.   Employment status:    Forensic scientist:  Managed Care PT Recommendations:  [place] / Referral to community resources:  [place]  Patient/Family's Response to care:  Patient & family state that he has never been to a facility in the past but is agreeable with plan, wife states that she just wants to make sure that he goes to a "good facility" patient requesting a private room. Wife to [facility] SNF, awaiting call back from [facility] re: bed offer.   Patient/Family's Understanding of and Emotional Response to Diagnosis, Current Treatment, and Prognosis:  CSW provided patient's wife with list of facilities, patient was quiet during conversation but did state that he  would prefer a private room.   Emotional Assessment Appearance:  Appears stated age Attitude/Demeanor/Rapport:    Affect (typically observed):  Calm, Pleasant Orientation:  Oriented to Self, Oriented to Place, Oriented to  Time, Oriented to Situation Alcohol / Substance use:    Psych involvement (Current and /or in the community):  No (Comment)  Discharge Needs  Concerns to be addressed:  Discharge Planning Concerns Readmission within the last 30 days:  No Current discharge risk:  Dependent with Mobility Barriers to Discharge:      Standley Brooking, LCSW 11/22/2014, 2:32 PM

## 2014-11-22 NOTE — Progress Notes (Signed)
[facility] PROGRESS NOTE   Shane Larson PXT:[id number] DOB: 27-Oct-1950 DOA: 11/20/2014 PCP: Marton Redwood, MD  HPI/Subjective: Seen with wife at bedside, has massive lower extremity pitting edema up to his waist. Still complaining about generalized weakness and loss of energy.  Assessment/Plan: Active Problems:   UTI (urinary tract infection)   AKI (acute kidney injury)   Hyperglycemia   Generalized weakness   Generalize weakness  -Secondary to acute kidney injury/dehydration  -Am cortisol  -TSH  -Ammonia 19  -Magnesium 2.4  -EKG--sinus rhythm negative for ST-T wave changes  -Chest x-ray is negative for edema or consolidations  -PT eval  Neuroendocrine tumor -likely carcinoid -Dr. Benay Spice to see in AM  Atrial fibrillation -Developed atrial fibrillation overnight, placed on Cardizem infusion. -Converted to normal sinus rhythm, will place him on Cardizem. -CHA2DS2-VASc of 1 because of diabetes, suggest and platelets, aspirin when he can take it. -currently platelets are low, hold ASA, check EKG.  Acute kidney injury  -Baseline creatinine around 0.7  -Patient has third spacing am not sure IV fluids will help that much, obtain renal ultrasound.  Thrombocytopenia -This has been chronic,but this is lower than his usual baseline  -Follow the trend, DIC panel showed increased DDimer but high fibrinogen as well.  Bacteriuria  -The patient does not have significant pyuria, nor is he symptomatic -However in the setting of the patient's generalized weakness-->continue ceftriaxone  - if the patient's urine culture has no growth, I will discontinue antibiotics altogether  Hyperglycemia -check A1c  Code Status: Full Code Family Communication: Plan discussed with the patient. Disposition Plan: Remains inpatient Diet:    Consultants:  None  Procedures:  None  Antibiotics:  None   Objective: Filed Vitals:   11/22/14 1219  BP: 123/78    Pulse:   Temp:   Resp:     Intake/Output Summary (Last 24 hours) at 11/22/14 1414 Last data filed at 11/22/14 0700  Gross per 24 hour  Intake 2412.5 ml  Output   1050 ml  Net 1362.5 ml   Filed Weights   11/20/14 1101 11/20/14 1645 11/20/14 2139  Weight: 89.812 kg (198 lb) 89.812 kg (198 lb) 89.4 kg (197 lb 1.5 oz)    Exam: General: Alert and awake, oriented x3, not in any acute distress. HEENT: anicteric sclera, pupils reactive to light and accommodation, EOMI CVS: S1-S2 clear, no murmur rubs or gallops Chest: clear to auscultation bilaterally, no wheezing, rales or rhonchi Abdomen: soft nontender, nondistended, normal bowel sounds, no organomegaly Extremities: +3 lateral pitting edema Neuro: Cranial nerves II-XII intact, no focal neurological deficits  Data Reviewed: Basic Metabolic Panel:  Recent Labs Lab 11/18/14 1407 11/20/14 1217 11/21/14 0531 11/22/14 0605  NA 139 135 136 136  K 3.5 3.8 3.8 3.8  CL  --  92* 96* 97*  CO2 33* 36* 32 31  GLUCOSE 306* 220* 322* 319*  BUN 19.9 36* 39* 41*  CREATININE 1.3 1.79* 1.77* 1.61*  CALCIUM 8.6 7.6* 7.2* 7.5*  MG  --  2.4  --   --    Liver Function Tests:  Recent Labs Lab 11/20/14 1217 11/22/14 0605  AST 21 20  ALT 32 30  ALKPHOS 134* 138*  BILITOT 2.0* 0.9  PROT 5.0* 4.3*  ALBUMIN 2.0* 1.5*   No results for input(s): LIPASE, AMYLASE in the last 168 hours.  Recent Labs Lab 11/20/14 1217  AMMONIA 19   CBC:  Recent Labs Lab 11/20/14 1217 11/21/14 0531 11/21/14 1352 11/22/14 0605  WBC 10.7* 10.6*  --  9.8  HGB 14.5 12.9*  --  12.6*  HCT 42.6 37.8*  --  36.5*  MCV 92.4 92.2  --  92.6  PLT 16* 17* 20* 23*   Cardiac Enzymes: No results for input(s): CKTOTAL, CKMB, CKMBINDEX, TROPONINI in the last 168 hours. BNP (last 3 results) No results for input(s): BNP in the last 8760 hours.  ProBNP (last 3 results) No results for input(s): PROBNP in the last 8760 hours.  CBG: No results for input(s):  GLUCAP in the last 168 hours.  Micro No results found for this or any previous visit (from the past 240 hour(s)).   Studies: US Renal  11/21/2014   CLINICAL DATA:  Renal failure.  Elevated creatinine and BUN.  EXAM: RENAL / URINARY TRACT ULTRASOUND COMPLETE  COMPARISON:  CT of the abdomen and pelvis on 10/22/2014  FINDINGS: Right Kidney:  Length: 13.7 cm. Echogenicity within normal limits. No mass or hydronephrosis visualized.  Left Kidney:  Length: 13.7 cm. Echogenicity within normal limits. No mass or hydronephrosis visualized.  Bladder:  Appears normal for degree of bladder distention.  Ascites and left pleural effusion are noted. Multiple hypoechoic liver lesions are noted.  IMPRESSION: 1. No hydronephrosis or focal renal mass. 2. Ascites, left pleural effusion. 3. Multiple liver masses.   Electronically Signed   By: Nolon Nations M.D.   On: 11/21/2014 14:53   Dg Chest Port 1 View  11/20/2014   CLINICAL DATA:  Weakness 1 the 3 days, unsteady gait, altered mental status, former smoker  EXAM: PORTABLE CHEST - 1 VIEW  COMPARISON:  None.  FINDINGS: The heart size and mediastinal contours are within normal limits. Both lungs are clear. The visualized skeletal structures are unremarkable.  IMPRESSION: No active disease.   Electronically Signed   By: Skipper Cliche M.D.   On: 11/20/2014 15:03    Scheduled Meds: . cefTRIAXone (ROCEPHIN)  IV  1 g Intravenous Q24H  . diltiazem  30 mg Oral 4 times per day  . pneumococcal 23 valent vaccine  0.5 mL Intramuscular Tomorrow-1000   Continuous Infusions: . sodium chloride 0.9 % 1,000 mL with potassium chloride 20 mEq infusion 75 mL/hr at 11/22/14 0517       Time spent: 35 minutes    [facility]  [facility] Pager [phone] If 7PM-7AM, please contact night-coverage at [email or web address], password [facility] 11/22/2014, 2:14 PM  LOS: 2 days

## 2014-11-22 NOTE — Progress Notes (Signed)
I placed fmla forms for [place] on the desk of nurse for dr.sherrill.

## 2014-11-23 ENCOUNTER — Encounter: Payer: Self-pay | Admitting: Oncology

## 2014-11-23 ENCOUNTER — Encounter (HOSPITAL_COMMUNITY): Admission: RE | Admit: 2014-11-23 | Payer: BLUE CROSS/BLUE SHIELD | Source: Ambulatory Visit

## 2014-11-23 DIAGNOSIS — N390 Urinary tract infection, site not specified: Secondary | ICD-10-CM | POA: Insufficient documentation

## 2014-11-23 DIAGNOSIS — C801 Malignant (primary) neoplasm, unspecified: Secondary | ICD-10-CM

## 2014-11-23 DIAGNOSIS — R160 Hepatomegaly, not elsewhere classified: Secondary | ICD-10-CM

## 2014-11-23 DIAGNOSIS — R531 Weakness: Secondary | ICD-10-CM | POA: Insufficient documentation

## 2014-11-23 LAB — CBC
HCT: 36.9 % — ABNORMAL LOW (ref 39.0–52.0)
Hemoglobin: 12.2 g/dL — ABNORMAL LOW (ref 13.0–17.0)
MCH: 30.7 pg (ref 26.0–34.0)
MCHC: 33.1 g/dL (ref 30.0–36.0)
MCV: 92.9 fL (ref 78.0–100.0)
Platelets: 29 10*3/uL — CL (ref 150–400)
RBC: 3.97 MIL/uL — ABNORMAL LOW (ref 4.22–5.81)
RDW: 14.8 % (ref 11.5–15.5)
WBC: 10 10*3/uL (ref 4.0–10.5)

## 2014-11-23 LAB — COMPREHENSIVE METABOLIC PANEL
ALK PHOS: 159 U/L — AB (ref 38–126)
ALT: 33 U/L (ref 17–63)
AST: 17 U/L (ref 15–41)
Albumin: 1.6 g/dL — ABNORMAL LOW (ref 3.5–5.0)
Anion gap: 9 (ref 5–15)
BILIRUBIN TOTAL: 1.1 mg/dL (ref 0.3–1.2)
BUN: 40 mg/dL — AB (ref 6–20)
CHLORIDE: 97 mmol/L — AB (ref 101–111)
CO2: 29 mmol/L (ref 22–32)
Calcium: 7.8 mg/dL — ABNORMAL LOW (ref 8.9–10.3)
Creatinine, Ser: 1.58 mg/dL — ABNORMAL HIGH (ref 0.61–1.24)
GFR, EST AFRICAN AMERICAN: 52 mL/min — AB (ref >60)
GFR, EST NON AFRICAN AMERICAN: 45 mL/min — AB (ref >60)
GLUCOSE: 331 mg/dL — AB (ref 70–99)
POTASSIUM: 3.9 mmol/L (ref 3.5–5.1)
Sodium: 135 mmol/L (ref 135–145)
Total Protein: 4.5 g/dL — ABNORMAL LOW (ref 6.5–8.1)

## 2014-11-23 MED ORDER — ENSURE ENLIVE PO LIQD
237.0000 mL | Freq: Two times a day (BID) | ORAL | Status: DC
  Administered 2014-11-26: 237 mL via ORAL

## 2014-11-23 MED ORDER — INSULIN ASPART 100 UNIT/ML ~~LOC~~ SOLN
0.0000 [IU] | Freq: Three times a day (TID) | SUBCUTANEOUS | Status: DC
  Administered 2014-11-24: 7 [IU] via SUBCUTANEOUS

## 2014-11-23 NOTE — Progress Notes (Signed)
Initial Nutrition Assessment  DOCUMENTATION CODES:  Not applicable  INTERVENTION:  Ensure Enlive (each supplement provides 350kcal and 20 grams of protein)  NUTRITION DIAGNOSIS:  Increased nutrient needs related to catabolic illness, cancer and cancer related treatments as evidenced by estimated needs.  GOAL:  Patient will meet greater than or equal to 90% of their needs  MONITOR:  PO intake, Weight trends, I & O's, Supplement acceptance  REASON FOR ASSESSMENT:  Consult Assessment of nutrition requirement/status  ASSESSMENT: Pt with recent dx of metastatic neuroendocrine carcinoma. Pt seen for consult for nutrition assessment. Pt reports good appetite PTA and now. He denies loss of appetite recently, abdominal pain or nausea with intakes, and denies any vomiting with intakes. Pt reports UBW of about 195 lbs but that weight has been fluctuating due to fluid accumulation. Pt did not want physical assessment completed at this time with multiple family present, but per rounds this AM, pt with +4 generalized edema. Per chart review, he ate 100% of all meals 5/1 and dinner 4/30.  Pt likely meeting needs but will add supplement due to increased needs. Labs and medications reviewed; BUN/creatinine elevated, GFR: 45.  Height:  Ht Readings from Last 1 Encounters:  11/23/14 6' 2.5" (1.892 m)    Weight:  Wt Readings from Last 1 Encounters:  11/23/14 197 lb 1.5 oz (89.4 kg)    Ideal Body Weight:  82.3 kg (kg)  Wt Readings from Last 10 Encounters:  11/23/14 197 lb 1.5 oz (89.4 kg)  11/12/14 200 lb 12.8 oz (91.082 kg)  11/04/14 202 lb 12.8 oz (91.989 kg)  10/26/14 207 lb (93.895 kg)  01/20/14 202 lb (91.627 kg)  01/13/14 202 lb 6.4 oz (91.808 kg)  01/08/14 199 lb 3.2 oz (90.357 kg)  01/31/12 208 lb (94.348 kg)  01/18/12 208 lb (94.348 kg)    BMI:  Body mass index is 24.97 kg/(m^2).  Estimated Nutritional Needs:  Kcal:  2200-2400  Protein:  105-125 grams  Fluid:   1.2-1.5L/day  Skin:  Reviewed, no issues  Diet Order:  Diet Heart Room service appropriate?: Yes; Fluid consistency:: Thin  EDUCATION NEEDS:  No education needs identified at this time   Intake/Output Summary (Last 24 hours) at 11/23/14 1208 Last data filed at 11/22/14 1900  Gross per 24 hour  Intake     50 ml  Output    400 ml  Net   -350 ml    Last BM:  5/2   Jarome Matin, RD, LDN Inpatient Clinical Dietitian Pager # [phone] After hours/weekend pager # [phone]

## 2014-11-23 NOTE — Consult Note (Signed)
Chief Complaint: Chief Complaint  Patient presents with  . Weakness  . Leg Swelling    right    Referring Physician(s): * No referring provider recorded for this case *  History of Present Illness: Shane Larson is a 64 y.o. 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 Since then, his functional status has worsened, with increasing lower extremity edema, renal insufficiency, thrombocytopenia, hypoalbuminemia, and weakness. He is able to get up and walk to the bath room. He has no lung mets, but the tumor burden in the liver is extreme. Carcinoid markers in his urine and serum are elevated. He has had no treatment yet.   Past Medical History  Diagnosis Date  . Colon polyps     adenomatous  . Diverticulosis   . Hemorrhoids   . Psoriasis   . Arthritis     psoriatic arthritis  . Hypertriglyceridemia     Past Surgical History  Procedure Laterality Date  . Colonoscopy      Allergies: Codeine  Medications: Prior to Admission medications   Medication Sig Start Date End Date Taking? Authorizing Provider  acetaminophen (TYLENOL) 325 MG tablet Take 2 tablets (650 mg total) by mouth every 6 (six) hours as needed (Maximum dose 2000 mg/day). 11/17/14  Yes Ladell Pier, MD  calcipotriene (DOVONOX) 0.005 % cream Apply 1 application topically as needed (pain psoriatic arthritis).   Yes Historical Provider, MD  furosemide (LASIX) 20 MG tablet Take 20 mg by mouth.   Yes Historical Provider, MD  lactulose (CHRONULAC) 10 GM/15ML solution Take 30 g by mouth daily.   Yes Historical Provider, MD  potassium chloride SA (K-DUR,KLOR-CON) 20 MEQ tablet Take 1 tablet (20 mEq total) by mouth 2 (two) times daily. For 3 days then one PO daily. 11/12/14  Yes Owens Shark, NP  spironolactone (ALDACTONE) 50 MG tablet Take 50 mg by mouth daily.   Yes Historical Provider, MD     Family History  Problem Relation Age of Onset  . Colon cancer Neg Hx   . Stroke Mother    . Congestive Heart Failure Mother   . COPD Father   . Skin cancer Brother   . Alzheimer's disease Maternal Grandfather     History   Social History  . Marital Status: Married    Spouse Name: N/A  . Number of Children: N/A  . Years of Education: N/A   Social History Main Topics  . Smoking status: Never Smoker   . Smokeless tobacco: Never Used  . Alcohol Use: 12.6 oz/week    21 Cans of beer per week  . Drug Use: No  . Sexual Activity: Not on file   Other Topics Concern  . None   Social History Narrative   Married, wife Eulis Canner   #2 children (adult)   Occupation: 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  All other systems are negative.  Review of Systems  Vital Signs: BP 150/74 mmHg  Pulse 62  Temp(Src) 97.7 F (36.5 C) (Oral)  Resp 18  Ht 6' 2.5" (1.892 m)  Wt 197 lb 1.5 oz (89.4 kg)  BMI 24.97 kg/m2  SpO2 96%  Physical Exam  Mallampati Score:     Imaging: Ct Chest W Contrast  10/29/2014   CLINICAL  DATA:  He liver. No chest complaints. Prior smoker for 52 years.  EXAM: CT CHEST WITH CONTRAST  TECHNIQUE: Multidetector CT imaging of the chest was performed during intravenous contrast administration.  CONTRAST:  35 mL Isovue-300  COMPARISON:  None.  FINDINGS: The central airways are patent. The lungs are clear. There is no pleural effusion or pneumothorax.  There are no pathologically enlarged axillary, hilar or mediastinal lymph nodes.  The heart size is normal. There is no pericardial effusion. The thoracic aorta is normal in caliber. Mild coronary artery atherosclerosis in the LAD.  Review of bone windows demonstrates no focal lytic or sclerotic lesions.  Limited non-contrast images of the upper abdomen were obtained. The adrenal glands appear normal. There are  innumerable hypodense masses throughout the liver.  IMPRESSION: 1. No findings in the chest to suggest malignancy. 2. No active cardiopulmonary disease. 3. Innumerable hypodense masses throughout the liver. Differential diagnosis includes metastatic disease versus primary hepatic malignancy such as hepatocellular carcinoma.   Electronically Signed   By: Kathreen Devoid   On: 10/29/2014 09:03   US Renal  11/21/2014   CLINICAL DATA:  Renal failure.  Elevated creatinine and BUN.  EXAM: RENAL / URINARY TRACT ULTRASOUND COMPLETE  COMPARISON:  CT of the abdomen and pelvis on 10/22/2014  FINDINGS: Right Kidney:  Length: 13.7 cm. Echogenicity within normal limits. No mass or hydronephrosis visualized.  Left Kidney:  Length: 13.7 cm. Echogenicity within normal limits. No mass or hydronephrosis visualized.  Bladder:  Appears normal for degree of bladder distention.  Ascites and left pleural effusion are noted. Multiple hypoechoic liver lesions are noted.  IMPRESSION: 1. No hydronephrosis or focal renal mass. 2. Ascites, left pleural effusion. 3. Multiple liver masses.   Electronically Signed   By: Nolon Nations M.D.   On: 11/21/2014 14:53   US Biopsy  10/26/2014   INDICATION: No known primary malignancy, now with innumerable hepatic lesions/masses worrisome for metastatic disease. Please perform ultrasound-guided liver lesion biopsy for tissue diagnostic purposes  EXAM: ULTRASOUND GUIDED LIVER LESION BIOPSY  COMPARISON:  CT abdomen pelvis -10/22/2014  MEDICATIONS: Fentanyl 75 mcg IV; Versed 1.5 mg IV  ANESTHESIA/SEDATION: Total Moderate Sedation time  30 minutes  COMPLICATIONS: None immediate  PROCEDURE: Informed written consent was obtained from the patient after a discussion of the risks, benefits and alternatives to treatment. The patient understands and consents the procedure. A timeout was performed prior to the initiation of the procedure.  Ultrasound scanning was performed of the right upper abdominal quadrant  demonstrates innumerable ill-defined mixed echogenic nodules and masses scattered throughout both the right and left lobes of the liver. A dominant approximately 2.9 x 2.1 cm mixed echogenic solid mass within the medial segment of the left lobe of the liver correlating with the lesion seen on preceding abdominal CT image 53, series 5 was targeted for biopsy given lesion location and visualization. The procedure was planned.  The right upper abdominal quadrant was prepped and draped in the usual sterile fashion. The overlying soft tissues were anesthetized with 1% lidocaine with epinephrine. A 17 gauge, 6.8 cm co-axial needle was advanced into a peripheral aspect of the lesion. This was followed by 4 core biopsies with an 18 gauge core device under direct ultrasound guidance.  The coaxial needle track was embolized with a small amount of Gel-Foam slurry. The co-axial needle was removed and hemostasis was obtained with manual compression. Post procedural scanning was negative for definitive area of hemorrhage or additional complication. A dressing was  placed. The patient tolerated the procedure well without immediate post procedural complication.  IMPRESSION: Technically successful ultrasound guided core needle biopsy of dominant indeterminate lesion within the medial segment of the left lobe of the liver.   Electronically Signed   By: Sandi Mariscal M.D.   On: 10/26/2014 16:43   Dg Chest Port 1 View  11/20/2014   CLINICAL DATA:  Weakness 1 the 3 days, unsteady gait, altered mental status, former smoker  EXAM: PORTABLE CHEST - 1 VIEW  COMPARISON:  None.  FINDINGS: The heart size and mediastinal contours are within normal limits. Both lungs are clear. The visualized skeletal structures are unremarkable.  IMPRESSION: No active disease.   Electronically Signed   By: Skipper Cliche M.D.   On: 11/20/2014 15:03    Labs:  CBC:  Recent Labs  11/20/14 1217 11/21/14 0531 11/21/14 1352 11/22/14 0605 11/23/14 0512    WBC 10.7* 10.6*  --  9.8 10.0  HGB 14.5 12.9*  --  12.6* 12.2*  HCT 42.6 37.8*  --  36.5* 36.9*  PLT 16* 17* 20* 23* 29*    COAGS:  Recent Labs  10/26/14 1230 11/21/14 0531 11/21/14 1352  INR 1.12 1.04 0.98  APTT 24 23* 21*    BMP:  Recent Labs  11/20/14 1217 11/21/14 0531 11/22/14 0605 11/23/14 0512  NA 135 136 136 135  K 3.8 3.8 3.8 3.9  CL 92* 96* 97* 97*  CO2 36* 32 31 29  GLUCOSE 220* 322* 319* 331*  BUN 36* 39* 41* 40*  CALCIUM 7.6* 7.2* 7.5* 7.8*  CREATININE 1.79* 1.77* 1.61* 1.58*  GFRNONAA 39* 39* 44* 45*  GFRAA 45* 46* 51* 52*    LIVER FUNCTION TESTS:  Recent Labs  11/08/14 1006 11/20/14 1217 11/22/14 0605 11/23/14 0512  BILITOT 1.68* 2.0* 0.9 1.1  AST 40* 21 20 17   ALT 90* 32 30 33  ALKPHOS 125 134* 138* 159*  PROT 4.9* 5.0* 4.3* 4.5*  ALBUMIN 2.8* 2.0* 1.5* 1.6*    TUMOR MARKERS:  Recent Labs  11/08/14 1006  CHROMGRNA 8000*    Assessment and Plan:  Mr. Wasko would benefit from gentle bland embolization, initially in the right half of his liver, but would be at risk for further organ dysfunction and death, secondary to the procedure. His thrombocytopenia and renal insuffiencey should be optimized prior to the procedure.  Thank you for this interesting consult.  I greatly enjoyed meeting Earl Losee and look forward to participating in their care.  Signed: Cosme Jacob, ART A 11/23/2014, 10:59 AM   I spent a total of 20 Minutes  in face to face in clinical consultation, greater than 50% of which was counseling/coordinating care for carcinoid tumor.

## 2014-11-23 NOTE — Progress Notes (Signed)
[facility] PROGRESS NOTE   Oral Remache CBJ:[id number] DOB: 1951-05-12 DOA: 11/20/2014 PCP: Marton Redwood, MD  HPI/Subjective: Overall feels better, still has lower extremity edema and left upper extremity edema as well.  Assessment/Plan: Active Problems:   UTI (urinary tract infection)   AKI (acute kidney injury)   Hyperglycemia   Generalized weakness   Generalize weakness  -This is likely secondary to the liver metastases. -Normal cortisol, TSH, ammonia and magnesium. -EKG--sinus rhythm negative for ST-T wave changes  -Chest x-ray is negative for edema or consolidations   Neuroendocrine tumor -Neuroendocrine tumor likely carcinoid tumor with multiple liver metastases -Seen by Dr. Ammie Dalton, who is looking into how to approach this. -Embolization of the liver planned by IR, recommended optimization of platelets and renal insufficiency prior to that.  Atrial fibrillation -Developed atrial fibrillation overnight, placed on Cardizem infusion. -Converted to normal sinus rhythm, will place him on Cardizem. -CHA2DS2-VASc of 1 because of diabetes, suggest and platelets, aspirin when he can take it. -currently platelets are low, hold ASA, EKG repeated, showed atrial bigeminy.  Acute kidney injury  -Baseline creatinine around 0.7  -Patient has third spacing am not sure IV fluids will help that much, IV fluids discontinued. -Renal ultrasound showed no obstructive uropathy.  Thrombocytopenia -This has been chronic, baseline around 70,000 -Platelets improving slowly but steadily.  Bacteriuria  -The patient does not have significant pyuria, nor is he symptomatic -However in the setting of the patient's generalized weakness-->continue ceftriaxone  - if the patient's urine culture has no growth, I will discontinue antibiotics.  Hyperglycemia -Hemoglobin A1c is 7.3 consistent with diabetes. -Carbohydrate modified diet, insulin sliding scale.  Code Status: Full  Code Family Communication: Plan discussed with the patient. Disposition Plan: Remains inpatient Diet: Diet Heart Room service appropriate?: Yes; Fluid consistency:: Thin  Consultants:  None  Procedures:  None  Antibiotics:  None   Objective: Filed Vitals:   11/23/14 0410  BP: 150/74  Pulse: 62  Temp: 97.7 F (36.5 C)  Resp: 18    Intake/Output Summary (Last 24 hours) at 11/23/14 1312 Last data filed at 11/22/14 1900  Gross per 24 hour  Intake     50 ml  Output    400 ml  Net   -350 ml   Filed Weights   11/20/14 1645 11/20/14 2139 11/23/14 1205  Weight: 89.812 kg (198 lb) 89.4 kg (197 lb 1.5 oz) 89.4 kg (197 lb 1.5 oz)    Exam: General: Alert and awake, oriented x3, not in any acute distress. HEENT: anicteric sclera, pupils reactive to light and accommodation, EOMI CVS: S1-S2 clear, no murmur rubs or gallops Chest: clear to auscultation bilaterally, no wheezing, rales or rhonchi Abdomen: soft nontender, nondistended, normal bowel sounds, no organomegaly Extremities: +3 lateral pitting edema Neuro: Cranial nerves II-XII intact, no focal neurological deficits  Data Reviewed: Basic Metabolic Panel:  Recent Labs Lab 11/18/14 1407 11/20/14 1217 11/21/14 0531 11/22/14 0605 11/23/14 0512  NA 139 135 136 136 135  K 3.5 3.8 3.8 3.8 3.9  CL  --  92* 96* 97* 97*  CO2 33* 36* 32 31 29  GLUCOSE 306* 220* 322* 319* 331*  BUN 19.9 36* 39* 41* 40*  CREATININE 1.3 1.79* 1.77* 1.61* 1.58*  CALCIUM 8.6 7.6* 7.2* 7.5* 7.8*  MG  --  2.4  --   --   --    Liver Function Tests:  Recent Labs Lab 11/20/14 1217 11/22/14 0605 11/23/14 0512  AST 21 20 17   ALT 32 30 33  ALKPHOS 134* 138* 159*  BILITOT 2.0* 0.9 1.1  PROT 5.0* 4.3* 4.5*  ALBUMIN 2.0* 1.5* 1.6*   No results for input(s): LIPASE, AMYLASE in the last 168 hours.  Recent Labs Lab 11/20/14 1217  AMMONIA 19   CBC:  Recent Labs Lab 11/20/14 1217 11/21/14 0531 11/21/14 1352 11/22/14 0605  11/23/14 0512  WBC 10.7* 10.6*  --  9.8 10.0  HGB 14.5 12.9*  --  12.6* 12.2*  HCT 42.6 37.8*  --  36.5* 36.9*  MCV 92.4 92.2  --  92.6 92.9  PLT 16* 17* 20* 23* 29*   Cardiac Enzymes: No results for input(s): CKTOTAL, CKMB, CKMBINDEX, TROPONINI in the last 168 hours. BNP (last 3 results) No results for input(s): BNP in the last 8760 hours.  ProBNP (last 3 results) No results for input(s): PROBNP in the last 8760 hours.  CBG: No results for input(s): GLUCAP in the last 168 hours.  Micro No results found for this or any previous visit (from the past 240 hour(s)).   Studies: US Renal  11/21/2014   CLINICAL DATA:  Renal failure.  Elevated creatinine and BUN.  EXAM: RENAL / URINARY TRACT ULTRASOUND COMPLETE  COMPARISON:  CT of the abdomen and pelvis on 10/22/2014  FINDINGS: Right Kidney:  Length: 13.7 cm. Echogenicity within normal limits. No mass or hydronephrosis visualized.  Left Kidney:  Length: 13.7 cm. Echogenicity within normal limits. No mass or hydronephrosis visualized.  Bladder:  Appears normal for degree of bladder distention.  Ascites and left pleural effusion are noted. Multiple hypoechoic liver lesions are noted.  IMPRESSION: 1. No hydronephrosis or focal renal mass. 2. Ascites, left pleural effusion. 3. Multiple liver masses.   Electronically Signed   By: Nolon Nations M.D.   On: 11/21/2014 14:53    Scheduled Meds: . cefTRIAXone (ROCEPHIN)  IV  1 g Intravenous Q24H  . diltiazem  30 mg Oral 4 times per day  . feeding supplement (ENSURE ENLIVE)  237 mL Oral BID BM  . pneumococcal 23 valent vaccine  0.5 mL Intramuscular Tomorrow-1000   Continuous Infusions:       Time spent: 35 minutes    [facility] A  [facility] Pager [phone] If 7PM-7AM, please contact night-coverage at [email or web address], password [employer] 11/23/2014, 1:12 PM  LOS: 3 days

## 2014-11-23 NOTE — Progress Notes (Addendum)
IP PROGRESS NOTE  Subjective:   Mr. Postlewaite denies pain at present. He does not feel like getting out of bed this morning.   Objective: Vital signs in last 24 hours: Blood pressure 150/74, pulse 62, temperature 97.7 F (36.5 C), temperature source Oral, resp. rate 18, height 6' 2.5" (1.892 m), weight 197 lb 1.5 oz (89.4 kg), SpO2 96 %.  Intake/Output from previous day: 05/02 0701 - 05/03 0700 In: 47 [IV Piggyback:50] Out: 400 [Urine:400]  Physical Exam:   Abdomen: The liver is palpable in the right upper abdomen Extremities: Anasarca   Lab Results:  Recent Labs  11/22/14 0605 11/23/14 0512  WBC 9.8 10.0  HGB 12.6* 12.2*  HCT 36.5* 36.9*  PLT 23* 29*   Blood smear: The platelets are decreased in number, some the platelets are large. No platelet clumps. No schistocytes. The polychromasia is not increased. The majority the white cells are mature neutrophils and bands forms. No nucleated red cells.   BMET  Recent Labs  11/22/14 0605 11/23/14 0512  NA 136 135  K 3.8 3.9  CL 97* 97*  CO2 31 29  GLUCOSE 319* 331*  BUN 41* 40*  CREATININE 1.61* 1.58*  CALCIUM 7.5* 7.8*    Studies/Results: US Renal  11/21/2014   CLINICAL DATA:  Renal failure.  Elevated creatinine and BUN.  EXAM: RENAL / URINARY TRACT ULTRASOUND COMPLETE  COMPARISON:  CT of the abdomen and pelvis on 10/22/2014  FINDINGS: Right Kidney:  Length: 13.7 cm. Echogenicity within normal limits. No mass or hydronephrosis visualized.  Left Kidney:  Length: 13.7 cm. Echogenicity within normal limits. No mass or hydronephrosis visualized.  Bladder:  Appears normal for degree of bladder distention.  Ascites and left pleural effusion are noted. Multiple hypoechoic liver lesions are noted.  IMPRESSION: 1. No hydronephrosis or focal renal mass. 2. Ascites, left pleural effusion. 3. Multiple liver masses.   Electronically Signed   By: Nolon Nations M.D.   On: 11/21/2014 14:53    Medications: I have reviewed the  patient's current medications.  Assessment/Plan:  1. 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 History of alcohol use 3. CT scan evidence of cirrhosis 10/22/2014 4. Thrombocytopenia 5. Hypokalemia likely secondary to diuretics. 6. Anasarca secondary to hypoalbuminemia 7. Renal insufficiency 8. Atrial fibrillation  Mr. Samad appears unchanged. He has an advanced neuroendocrine tumor involving the liver. The primary tumor site is unknown. I was informed by radiology this morning that an octreotide scan cannot be performed as an inpatient. I will review the case with interventional radiology today to see if he is a candidate for bland embolization or radioembolization of the liver.  I discussed the difficult situation with Mr. Summerlin and his wife. They understand treatment options are limited by his performance status, thrombocytopenia, and renal insufficiency.  Recommendations: 1. Proceed with plan for skilled nursing facility placement if patient unable to be cared for in the home 2. I plan to contact Dr. Leamon Arnt in GI oncology at [facility] today to discuss treatment options. 3. I will ask interventional radiology to review his case today.    LOS: 3 days   [place]  11/23/2014, 8:21 AM  I discussed the case with interventional radiology. He is at high risk  for complications from hepatic directed therapy. I reviewed the case with the GI oncology service at [facility]. They agree he is a poor candidate for systemic chemotherapy and hepatic directed therapy.  I had a brief discussion with Mr. Mceachin this evening. He plans to pursue skilled nursing facility placement. I will discuss Hospice care with Mr. Releford in  the morning on 11/24/2014.

## 2014-11-23 NOTE — Progress Notes (Signed)
Physical Therapy Treatment Patient Details Name: Shane Larson MRN: [id number] DOB: 01/19/1951 Today's Date: 11/23/2014    History of Present Illness 64 year old male with a history of neuroendocrine tumor, likely carcinoid, presents with one-day history of progressive generalized weakness to the point where he was unable to get out of bed. The patient has been feeling weak for the last several weeks and has progressively worsened.    PT Comments    Pt OOB in recliner.  Required + 2 MAX assist to rise and used B platform EVA walker to amb.  Very weak/deconditioned.  Pt will need ST Rehab at SNF prior to returning home.   Follow Up Recommendations  SNF     Equipment Recommendations  Rolling walker with 5" wheels    Recommendations for Other Services       Precautions / Restrictions Precautions Precautions: Fall Restrictions Weight Bearing Restrictions: No    Mobility  Bed Mobility               General bed mobility comments: Pt OOB in recliner  Transfers Overall transfer level: Needs assistance Equipment used: Bilateral platform walker (EVA walker) Transfers: Sit to/from Stand Sit to Stand: Max assist;+2 physical assistance         General transfer comment: + 2 MAX assist to rise from lower surface (recliner) with 75% VC's on proper tech and hand placement.    Ambulation/Gait Ambulation/Gait assistance: +2 physical assistance;Mod assist;Max assist Ambulation Distance (Feet): 22 Feet (22 feet x 2 one standing rest break) Assistive device: Bilateral platform walker (EVA walker) Gait Pattern/deviations: Step-to pattern;Step-through pattern;Decreased step length - right;Decreased step length - left;Decreased stride length;Trunk flexed Gait velocity: decreased   General Gait Details: very unsteady/sluggish gait with much support through B platform walker.  B LE "heavy" and b knees buckle with fatigue.  Recliner following for safety.    Stairs             Wheelchair Mobility    Modified Rankin (Stroke Patients Only)       Balance                                    Cognition Arousal/Alertness: Awake/alert Behavior During Therapy: WFL for tasks assessed/performed Overall Cognitive Status: Impaired/Different from baseline Area of Impairment: Problem solving             Problem Solving: Slow processing General Comments: Pt a little slow with processing and seems just a bit "off" . Wife reports noticing the same thing.    Exercises      General Comments        Pertinent Vitals/Pain Pain Assessment: No/denies pain    Home Living                      Prior Function            PT Goals (current goals can now be found in the care plan section) Progress towards PT goals: Progressing toward goals    Frequency  Min 3X/week    PT Plan      Co-evaluation             End of Session Equipment Utilized During Treatment: Gait belt Activity Tolerance: Patient limited by fatigue Patient left: in chair;with call bell/phone within reach;with family/visitor present     Time: 2376-2831 PT Time Calculation (min) (ACUTE ONLY): 18 min  Charges:  $Gait Training:  8-22 mins                    G Codes:      Shane Larson  PTA [facility]  Acute  Rehab Pager      [phone]

## 2014-11-23 NOTE — Progress Notes (Signed)
I called and Mrs. Brogden is coming to pick up her copy from front desk- Ms. Wilma.

## 2014-11-24 ENCOUNTER — Encounter (HOSPITAL_COMMUNITY): Payer: BLUE CROSS/BLUE SHIELD

## 2014-11-24 LAB — GLUCOSE, CAPILLARY
GLUCOSE-CAPILLARY: 326 mg/dL — AB (ref 70–99)
GLUCOSE-CAPILLARY: 309 mg/dL — AB (ref 70–99)
Glucose-Capillary: 455 mg/dL — ABNORMAL HIGH (ref 70–99)
Glucose-Capillary: 175 mg/dL — ABNORMAL HIGH (ref 70–99)

## 2014-11-24 MED ORDER — INSULIN ASPART 100 UNIT/ML ~~LOC~~ SOLN
15.0000 [IU] | Freq: Once | SUBCUTANEOUS | Status: AC
  Administered 2014-11-24: 15 [IU] via SUBCUTANEOUS

## 2014-11-24 MED ORDER — INSULIN ASPART 100 UNIT/ML ~~LOC~~ SOLN
0.0000 [IU] | Freq: Every day | SUBCUTANEOUS | Status: DC
  Administered 2014-11-25: 2 [IU] via SUBCUTANEOUS

## 2014-11-24 MED ORDER — INSULIN ASPART 100 UNIT/ML ~~LOC~~ SOLN
0.0000 [IU] | Freq: Three times a day (TID) | SUBCUTANEOUS | Status: DC
  Administered 2014-11-24: 11 [IU] via SUBCUTANEOUS
  Administered 2014-11-25: 5 [IU] via SUBCUTANEOUS
  Administered 2014-11-26 (×2): 3 [IU] via SUBCUTANEOUS

## 2014-11-24 MED ORDER — INSULIN ASPART 100 UNIT/ML ~~LOC~~ SOLN: 3.0000 [IU] | Freq: Three times a day (TID) | SUBCUTANEOUS | Status: DC

## 2014-11-24 MED ORDER — INSULIN GLARGINE 100 UNIT/ML ~~LOC~~ SOLN
5.0000 [IU] | Freq: Every day | SUBCUTANEOUS | Status: DC
  Administered 2014-11-24 – 2014-11-26 (×2): 5 [IU] via SUBCUTANEOUS
  Filled 2014-11-24 (×3): qty 0.05

## 2014-11-24 NOTE — Progress Notes (Signed)
IP PROGRESS NOTE  Subjective:   Shane Larson denies pain at present. No new complaint. He has decided on transfer to [facility] facility.   Objective: Vital signs in last 24 hours: Blood pressure 146/73, pulse 61, temperature 97.9 F (36.6 C), temperature source Oral, resp. rate 18, height 6' 2.5" (1.892 m), weight 197 lb 1.5 oz (89.4 kg), SpO2 97 %.  Intake/Output from previous day: 05/03 0701 - 05/04 0700 In: 1080 [P.O.:1080] Out: 375 [Urine:375]  Physical Exam:   Abdomen: The liver is palpable in the right upper abdomen Extremities: Anasarca   Lab Results:  Recent Labs  11/22/14 0605 11/23/14 0512  WBC 9.8 10.0  HGB 12.6* 12.2*  HCT 36.5* 36.9*  PLT 23* 29*   Blood smear: The platelets are decreased in number, some the platelets are large. No platelet clumps. No schistocytes. The polychromasia is not increased. The majority the white cells are mature neutrophils and bands forms. No nucleated red cells.   BMET  Recent Labs  11/22/14 0605 11/23/14 0512  NA 136 135  K 3.8 3.9  CL 97* 97*  CO2 31 29  GLUCOSE 319* 331*  BUN 41* 40*  CREATININE 1.61* 1.58*  CALCIUM 7.5* 7.8*    Medications: I have reviewed the patient's current medications.  Assessment/Plan:  1. 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 History of alcohol use 3. CT scan evidence of cirrhosis 10/22/2014 4. Thrombocytopenia 5. Hypokalemia likely secondary to diuretics. 6. Anasarca secondary to hypoalbuminemia 7. Renal insufficiency 8. Atrial fibrillation  Shane Larson appears unchanged. He has an advanced neuroendocrine tumor involving the liver. The primary tumor site is unknown. He has a poor  performance status. Shane prognosis is poor. I discussed treatment options with Shane Larson and Shane Larson. He is not a candidate for hepatic directed therapy or systemic chemotherapy at present. He agrees to a Hospice referral. We will make a referral to the [facility] program. They may be able to follow him at the skilled nursing facility and can continue Hospice care at home.  Recommendations: 1. Proceed with plan for skilled nursing facility placement  2. [facility] referral   I discussed the case with Dr. Brigitte Pulse.  LOS: 4 days   Shane Larson  11/24/2014, 1:15 PM

## 2014-11-24 NOTE — Progress Notes (Signed)
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 The patient has been feeling weak for the last several weeks and has progressively worsened.    PT Comments    Pt OOB in recliner.  Assisted with amb a greater distance in hallway using B platform EVA walker then assisted back to bed.   Follow Up Recommendations  SNF     Equipment Recommendations  Rolling walker with 5" wheels    Recommendations for Other Services       Precautions / Restrictions Precautions Precautions: Fall Restrictions Weight Bearing Restrictions: No    Mobility  Bed Mobility Overal bed mobility: Needs Assistance Bed Mobility: Sit to Supine     Supine to sit: Max assist     General bed mobility comments: Max asssit to support b LE into bed  Transfers Overall transfer level: Needs assistance Equipment used: Bilateral platform walker (EVA walker) Transfers: Sit to/from Stand Sit to Stand: Max assist;+2 physical assistance         General transfer comment: + 2 MAX assist to rise from lower surface (recliner) with 75% VC's on proper tech and hand placement.    Ambulation/Gait Ambulation/Gait assistance: +2 physical assistance;Mod assist Ambulation Distance (Feet): 85 Feet Assistive device: Bilateral platform walker (EVA walker) Gait Pattern/deviations: Step-through pattern;Decreased stride length;Decreased stance time - right Gait velocity: decreased   General Gait Details: tolerated increased distance.  Still used EVA walker for increased support and to increase activity    Financial trader Rankin (Stroke Patients Only)       Balance                                     Cognition Arousal/Alertness: Awake/alert Behavior During Therapy: WFL for tasks assessed/performed                        Exercises      General Comments        Pertinent Vitals/Pain Pain Assessment: 0-10 Pain Score: 3  Pain Location: R knee Pain Descriptors / Indicators: Aching Pain Intervention(s): Monitored during session    Home Living                      Prior Function            PT Goals (current goals can now be found in the care plan section) Progress towards PT goals: Progressing toward goals    Frequency  Min 3X/week    PT Plan      Co-evaluation             End of Session Equipment Utilized During Treatment: Gait belt Activity Tolerance: Patient tolerated treatment well Patient left: in bed;with call bell/phone within reach     Time: 1430-1448 PT Time Calculation (min) (ACUTE ONLY): 18 min  Charges:  $Gait Training: 8-22 mins                    G Codes:      Rica Koyanagi  PTA [facility]  Acute  Rehab  Pager      [phone]

## 2014-11-24 NOTE — Progress Notes (Signed)
Chaplain referred to see Shane Larson while rounding. Shane Larson was with family and eating. He declined spiritual care support at this time.  Please page Chaplain if Shane Larson needs and/or wishes Spiritual Care.  Sallee Lange. Trannie Bardales, DMin, MDiv, MA Chaplain

## 2014-11-24 NOTE — Progress Notes (Signed)
Inpatient Diabetes Program Recommendations  AACE/ADA: New Consensus Statement on Inpatient Glycemic Control (2013)  Target Ranges:  Prepandial:   less than 140 mg/dL      Peak postprandial:   less than 180 mg/dL (1-2 hours)      Critically ill patients:  140 - 180 mg/dL    Results for JEOVANI, WEISENBURGER (MRN [id number]) as of 11/24/2014 13:37  Ref. Range 11/24/2014 07:19 11/24/2014 11:24  Glucose-Capillary Latest Ref Range: 70-99 mg/dL 326 (H) 455 (H)    Results for JORDON, KRISTIANSEN (MRN [id number]) as of 11/24/2014 13:37  Ref. Range 11/20/2014 18:15  Hemoglobin A1C Latest Ref Range: 4.8-5.6 % 7.3 (H)    Admit with: Weakness/ Acute Kidney Injury  History: Neuroendocrine Neoplasm with Liver Mets    **Note lab glucose levels elevated for several days now.  **A1c 7.3%- No previous History of DM noted by patient.  **Note Novolog Sensitive SSI started this AM.  CBGs extremely elevated despite addition of Novolog this AM.   Spoke with patient (and his daughter) this morning about his elevated glucose levels and his A1c results.  Patient stated he was never diagnosed with DM and was surprised to hear his glucose levels are high.  Explained what an A1c is and what it measures.  Explained to patient the extreme importance of good glucose control at home.  Patient told me he has been having frequent urination for several days now.  Explained to patient that the body will try to rid itself of excess glucose through urination which could lead to dehydration.  Discussed with patient and daughter about healthy DM nutrition plan at home (patient is a vegetarian).  Discussed what foods contain carbohydrates and also explained about the importance of serving sizes.  Encouraged patient to avoid beverages with sugar (both natural and added).  Patient stated he consumes mostly water and fruit juice.  Encouraged patient to limit fruit juice to 1/2 cup serving per day.  Patient stated he will be going to SNF for Rehab.   Encouraged patient to allow RNs to show him how to check CBGs here in hospital so he will know how to check CBGs once he gets home from the SNF.   MD- Please consider more aggressive insulin regimen for patient in-hospital as CBGs are 300-400 range today  Patient may require basal insulin in addition to higher dose of Novolog SSI  Recommend the following:  1. Add Levemir 17 units daily (0.2 units/kg)  2. Increase SSI to Moderate scale tid ac + HS    Will follow Wyn Quaker RN, MSN, CDE Diabetes Coordinator Inpatient Diabetes Program Team Pager: [phone] (8a-5p)

## 2014-11-24 NOTE — Progress Notes (Signed)
  PROGRESS NOTE  Shane Larson EKB:[id number] DOB: 1950-10-23 DOA: 11/20/2014 PCP: Marton Redwood, MD  Assessment/Plan: 1. Metastatic neuroendocrine tumor involving liver. Primary site unknown. Not a candidate for chemotherapy or hepatic therapy per Dr. Benay Spice.[facility] by oncology. 2. Cirrhosis by CT 3. Thrombocytopenia, stable. 4. Generalized weakness. Normal coritsol TSH, ammonia 5. New diagnosis atrial fibrillation, converted to SR on diltiazem. No tx given goals of care. Could consider ASA. CHA2DS2-VASc of 1 because of diabetes 6. AKI. Renal ultrasound showed no obstructive uropathy 7. Possible UTI. Treated with Rocephin. 8. DM type 2, new dx? Hgb A1c 7.3. Oral therapy could be considered.   Overall appears stable. Allow the patient to rest.  Given his goals of care, pursue conservative therapy.  Discussed treatment options for diabetes with patient prior to discharge.  Code Status: full code DVT prophylaxis: SCDs Family Communication:  Disposition Plan: SNF  Murray Hodgkins, MD  [facility]  Pager [phone] If 7PM-7AM, please contact night-coverage at [email or web address], password [facility] 11/24/2014, 6:42 PM  LOS: 4 days   Consultants:  PT SNF  Procedures:    Antibiotics:  Ceftriaxone 4/30 >> 5/4  HPI/Subjective: Tired. Otherwise no complaints. No pain. No nausea or vomiting.  Objective: Filed Vitals:   11/23/14 1807 11/23/14 2217 11/24/14 0439 11/24/14 1420  BP: 155/88 161/80 146/73 160/72  Pulse: 64 66 61 72  Temp:  98.4 F (36.9 C) 97.9 F (36.6 C) 97.8 F (36.6 C)  TempSrc:  Oral Oral Oral  Resp:  18 18 18   Height:      Weight:      SpO2:  93% 97% 97%    Intake/Output Summary (Last 24 hours) at 11/24/14 1842 Last data filed at 11/24/14 1700  Gross per 24 hour  Intake    600 ml  Output      0 ml  Net    600 ml     Filed Weights   11/20/14 1645 11/20/14 2139 11/23/14 1205  Weight: 89.812 kg (198 lb) 89.4 kg (197 lb 1.5 oz) 89.4  kg (197 lb 1.5 oz)    Exam:      General:  Appears calm and comfortable Cardiovascular: RRR, no m/r/g. No LE edema. Respiratory: CTA bilaterally, no w/r/r. Normal respiratory effort. Psychiatric: grossly normal mood and affect, speech fluent and appropriate Neurologic: grossly non-focal.  New data reviewed:  Blood sugars 300-400s  Creatinine slowly trending down, 1.58.  Hemoglobin stable 12.2. Platelet count stable 29  Multiple voids   Scheduled Meds: . cefTRIAXone (ROCEPHIN)  IV  1 g Intravenous Q24H  . diltiazem  30 mg Oral 4 times per day  . feeding supplement (ENSURE ENLIVE)  237 mL Oral BID BM  . insulin aspart  0-15 Units Subcutaneous TID WC  . insulin aspart  0-5 Units Subcutaneous QHS  . insulin glargine  5 Units Subcutaneous Daily  . pneumococcal 23 valent vaccine  0.5 mL Intramuscular Tomorrow-1000   Continuous Infusions:   Principal Problem:   Liver mass Active Problems:   UTI (urinary tract infection)   AKI (acute kidney injury)   Hyperglycemia   Generalized weakness   UTI (lower urinary tract infection)   Weakness   Time spent 20 minutes

## 2014-11-25 ENCOUNTER — Encounter (HOSPITAL_COMMUNITY): Payer: BLUE CROSS/BLUE SHIELD

## 2014-11-25 ENCOUNTER — Other Ambulatory Visit: Payer: BLUE CROSS/BLUE SHIELD

## 2014-11-25 ENCOUNTER — Ambulatory Visit: Payer: BLUE CROSS/BLUE SHIELD | Admitting: Nurse Practitioner

## 2014-11-25 LAB — GLUCOSE, CAPILLARY
GLUCOSE-CAPILLARY: 240 mg/dL — AB (ref 70–99)
GLUCOSE-CAPILLARY: 229 mg/dL — AB (ref 70–99)

## 2014-11-25 NOTE — Care Management Note (Signed)
Case Management Note  Patient Details  Name: Davionte Lusby MRN: [id number] Date of Birth: 15-Jul-1951  Subjective/Objective:  64 y/o m admitted w/liver mass.                  Action/Plan:d/c snf.   Expected Discharge Date:  11/25/14               Expected Discharge Plan:  [facility]  In-House Referral:     Discharge planning Services  CM Consult  Post Acute Care Choice:    Choice offered to:     DME Arranged:    DME Agency:     HH Arranged:    Advance Agency:     Status of Service:  Completed, signed off  Medicare Important Message Given:    Date Medicare IM Given:    Medicare IM give by:    Date Additional Medicare IM Given:    Additional Medicare Important Message give by:     If discussed at [place] of Stay Meetings, dates discussed:    Additional Comments:  Dessa Phi, RN 11/25/2014, 1:43 PM

## 2014-11-25 NOTE — Progress Notes (Signed)
IP PROGRESS NOTE  Subjective:   Shane Larson appears comfortable. He wants to "sleep "this morning.   Objective: Vital signs in last 24 hours: Blood pressure 151/72, pulse 67, temperature 98.4 F (36.9 C), temperature source Oral, resp. rate 16, height 6' 2.5" (1.892 m), weight 197 lb 1.5 oz (89.4 kg), SpO2 96 %.  Intake/Output from previous day: 05/04 0701 - 05/05 0700 In: 850 [P.O.:800; IV Piggyback:50] Out: 500 [Urine:500]  Physical Exam:  Not performed today   Lab Results:  Recent Labs  11/23/14 0512  WBC 10.0  HGB 12.2*  HCT 36.9*  PLT 29*   Blood smear: The platelets are decreased in number, some the platelets are large. No platelet clumps. No schistocytes. The polychromasia is not increased. The majority the white cells are mature neutrophils and bands forms. No nucleated red cells.   BMET  Recent Labs  11/23/14 0512  NA 135  K 3.9  CL 97*  CO2 29  GLUCOSE 331*  BUN 40*  CREATININE 1.58*  CALCIUM 7.8*    Medications: I have reviewed the patient's current medications.  Assessment/Plan:  1. 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 History of alcohol use 3. CT scan evidence of cirrhosis 10/22/2014 4. Thrombocytopenia 5. Hypokalemia likely secondary to diuretics. 6. Anasarca secondary to hypoalbuminemia 7. Renal insufficiency 8. Atrial fibrillation  Shane Larson appears unchanged. He is scheduled for transfer to [facility] facility. I made a [facility] hosp[facility] referral. I had further discussion with Shane Larson today regarding the poor prognosis. She is in agreement with Hospice care.  Recommendations: 1. Proceed with plan for skilled nursing facility placement  2.  [facility] referral to follow him at the skilled nursing facility and when he returns home 3. Outpatient follow-up will be scheduled at the Cancer center for 12/06/2014.  Please call oncology as needed.  LOS: 5 days   [place]  11/25/2014, 2:15 PM

## 2014-11-25 NOTE — Progress Notes (Signed)
   11/25/14 1400  Clinical Encounter Type  Visited With Patient;Patient and family together  Visit Type Other (Comment) (Advance Directive)  Referral From Chaplain;Social work  Consult/Referral To Social work   Clinical biochemist assisted pt in completing advance directive paperwork.  AD notarized by SW.  Copy given to medical records and will be scanned into pt chart under "Chart review / Media tab".   Patient given original and two copies.    [place], [place]

## 2014-11-25 NOTE — Progress Notes (Signed)
  PROGRESS NOTE  Shane Larson ZJI:[id number] DOB: Jul 09, 1951 DOA: 11/20/2014 PCP: Marton Redwood, MD   Summary: 63yom with metastatic neuroendocrine tumor presented with generalized weakness.admitted for AKI, generalized weakness.   Assessment/Plan: 1. Metastatic neuroendocrine tumor involving liver. Primary site unknown. Not a candidate for chemotherapy or hepatic therapy per Dr. Benay Spice. Hospice referral made by oncology. 2. AKI. Renal ultrasound showed no obstructive uropathy. Improving with supportive care. 3. Severe thrombocytopenia, stable, secondary to malignancy, liver dysfunction. No evidence of DIC. 4. Generalized weakness. Normal cortisol, TSH, ammonia. Secondary to malignancy. 5. New diagnosis atrial fibrillation, converted to SR on diltiazem. No tx given goals of care. Could consider ASA. CHA2DS2-VASc of 1 because of diabetes. 6. Cirrhosis by CT 7. Possible UTI. Treated with Rocephin. No culture was sent. 8. DM type 2, new dx. Hgb A1c 7.3. Stable. Continue insulin.  9. Anasarca     Overall he remains stable. Blood sugar control has improved with insulin.  Given goals of care, conservative management has been pursued. Hospice has been recommended by his oncologist.  Anticipate transfer to skilled nursing facility when insurance approval has been obtained. This has not yet been obtained.  Code Status: full code DVT prophylaxis: SCDs Family Communication: Discussed with his brother at bedside. Disposition Plan: SNF  Murray Hodgkins, MD  [facility]  Pager [phone] If 7PM-7AM, please contact night-coverage at [email or web address], password [facility] 11/25/2014, 2:24 PM  LOS: 5 days   Consultants:  PT SNF  Procedures:    Antibiotics:  Ceftriaxone 4/30 >> 5/4  HPI/Subjective: Feeling better after sleep. No pain or complaints at this point.  Objective: Filed Vitals:   11/24/14 1420 11/24/14 2205 11/25/14 0647 11/25/14 1355  BP: 160/72 162/79 157/56 151/72  Pulse:  72 67 63 67  Temp: 97.8 F (36.6 C) 98.4 F (36.9 C) 98.5 F (36.9 C) 98.4 F (36.9 C)  TempSrc: Oral Oral Oral Oral  Resp: 18 18 18 16   Height:      Weight:      SpO2: 97% 96% 97% 96%    Intake/Output Summary (Last 24 hours) at 11/25/14 1424 Last data filed at 11/25/14 1243  Gross per 24 hour  Intake    490 ml  Output   1200 ml  Net   -710 ml     Filed Weights   11/20/14 1645 11/20/14 2139 11/23/14 1205  Weight: 89.812 kg (198 lb) 89.4 kg (197 lb 1.5 oz) 89.4 kg (197 lb 1.5 oz)    Exam:     Afebrile, VSS, no hypoxia General:  Appears calm and comfortable Cardiovascular: RRR, no m/r/g. 3+ bilateral lower extremity edema. Respiratory: CTA bilaterally, no w/r/r. Normal respiratory effort. Psychiatric: grossly normal mood and affect, speech fluent and appropriate  New data reviewed:  Blood sugars improved  Scheduled Meds: . diltiazem  30 mg Oral 4 times per day  . feeding supplement (ENSURE ENLIVE)  237 mL Oral BID BM  . insulin aspart  0-15 Units Subcutaneous TID WC  . insulin aspart  0-5 Units Subcutaneous QHS  . insulin glargine  5 Units Subcutaneous Daily  . pneumococcal 23 valent vaccine  0.5 mL Intramuscular Tomorrow-1000   Continuous Infusions:   Principal Problem:   Liver mass Active Problems:   UTI (urinary tract infection)   AKI (acute kidney injury)   Hyperglycemia   Generalized weakness   UTI (lower urinary tract infection)   Weakness

## 2014-11-25 NOTE — Progress Notes (Signed)
PT Cancellation Note  Patient Details Name: Shane Larson MRN: [id number] DOB: Feb 09, 1951   Cancelled Treatment:      C/M pt resting possible D/C today   Duric,[place] Student PTA  11/25/2014, 1:22 PM  Rica Koyanagi  PTA [facility]  Acute  Rehab Pager      [phone]

## 2014-11-26 ENCOUNTER — Other Ambulatory Visit: Payer: Self-pay | Admitting: *Deleted

## 2014-11-26 ENCOUNTER — Telehealth: Payer: Self-pay | Admitting: Oncology

## 2014-11-26 DIAGNOSIS — E119 Type 2 diabetes mellitus without complications: Secondary | ICD-10-CM

## 2014-11-26 LAB — GLUCOSE, CAPILLARY
GLUCOSE-CAPILLARY: 195 mg/dL — AB (ref 70–99)
GLUCOSE-CAPILLARY: 153 mg/dL — AB (ref 70–99)
Glucose-Capillary: 220 mg/dL — ABNORMAL HIGH (ref 70–99)

## 2014-11-26 MED ORDER — SIMETHICONE 80 MG PO CHEW
80.0000 mg | CHEWABLE_TABLET | Freq: Four times a day (QID) | ORAL | Status: DC | PRN
  Administered 2014-11-26: 80 mg via ORAL
  Filled 2014-11-26 (×2): qty 1

## 2014-11-26 MED ORDER — ENSURE ENLIVE PO LIQD: 237.0000 mL | Freq: Two times a day (BID) | ORAL | Status: AC

## 2014-11-26 MED ORDER — SIMETHICONE 80 MG PO CHEW: 80.0000 mg | CHEWABLE_TABLET | Freq: Four times a day (QID) | ORAL | Status: AC | PRN

## 2014-11-26 MED ORDER — INSULIN GLARGINE 100 UNIT/ML ~~LOC~~ SOLN: 5.0000 [IU] | Freq: Every day | SUBCUTANEOUS | Status: AC

## 2014-11-26 NOTE — Telephone Encounter (Signed)
MD visit added per 05/06 POF, pt is being discharged from hospital and will receive a summary with added MD visit for 05/18... KJ

## 2014-11-26 NOTE — Care Management Note (Signed)
Case Management Note  Patient Details  Name: Gifford Ballon MRN: [id number] Date of Birth: Oct 01, 1950  Subjective/Objective:                    Action/Plan:d/c snf   Expected Discharge Date:  11/23/14               Expected Discharge Plan:  Skilled Nursing Facility  In-House Referral:     Discharge planning Services  CM Consult  Post Acute Care Choice:    Choice offered to:     DME Arranged:    DME Agency:     HH Arranged:    [place] Agency:     Status of Service:  Completed, signed off  Medicare Important Message Given:    Date Medicare IM Given:    Medicare IM give by:    Date Additional Medicare IM Given:    Additional Medicare Important Message give by:     If discussed at [place] of Stay Meetings, dates discussed:    Additional Comments:  Dessa Phi, RN 11/26/2014, 1:00 PM

## 2014-11-26 NOTE — Discharge Summary (Signed)
Physician Discharge Summary  Shane Larson IOX:[id number] DOB: 1950-11-04 DOA: 11/20/2014  PCP: Marton Redwood, MD  Admit date: 11/20/2014 Discharge date: 11/26/2014  Recommendations for Outpatient Follow-up:  1. Transfer to SNF, palliative care, hospice referral made by Dr. Benay Spice for metastatic neuroendocrine tumor. 2. Severe thrombocytopenia 3. New diagnosis of DM 4. FTT   Follow-up Information    Follow up with Betsy Coder, MD.   Specialty:  Oncology   Why:  As needed   Contact information:   [place] [address] [phone]      Discharge Diagnoses:  1. Metastatic neuroendocrine tumor involving liver 2. AKI 3. Severe thrombocytopenia 4. Generalized weakness 5. Transient atrial fibrillation 6. UTI 7. DM type 2, new diagnosis 8. Anasarca, hypoalbuminemia  Discharge Condition: improved Disposition: SNF  Diet recommendation: regular  Filed Weights   11/20/14 1645 11/20/14 2139 11/23/14 1205  Weight: 89.812 kg (198 lb) 89.4 kg (197 lb 1.5 oz) 89.4 kg (197 lb 1.5 oz)    History of present illness:  63yom with metastatic neuroendocrine tumor presented with generalized weakness.admitted for AKI, generalized weakness.   Hospital Course:  Shane Larson was admitted and treated for AKI with IVF with improvement. AKI secondary to FTT. He had a brief episode of atrial fibrillation when physically uncomfortable but converted quickly to SR. He was seen by oncology and recommendations were for no further treatment and SNF/palliative and hospice referral were made. Discussed in detail with wife at bedside, reviewed testing, new dx, treatment and medications. Conservative therapy recommended.  1. Metastatic neuroendocrine tumor involving liver. Primary site unknown. Not a candidate for chemotherapy or hepatic therapy per Dr. Benay Spice. Hospice referral made by oncology. 2. AKI. Renal ultrasound showed no obstructive uropathy. Improving with supportive  care. 3. Severe thrombocytopenia, stable, secondary to malignancy, liver dysfunction. No evidence of DIC. 4. Generalized weakness. Normal cortisol, TSH, ammonia. Secondary to malignancy. 5. New diagnosis atrial fibrillation, converted to SR. Stable. Given discussion with family/GOC, no treatment at this point. 6. Possible UTI. Treated with Rocephin. No culture was sent. 7. DM type 2, new dx. Hgb A1c 7.3. Stable.  8. Anasarca   Consultants:  PT SNF  Oncology  Procedures:    Antibiotics:  Ceftriaxone 4/30 >> 5/4   Discharge Instructions   Current Discharge Medication List    START taking these medications   Details  feeding supplement, ENSURE ENLIVE, (ENSURE ENLIVE) LIQD Take 237 mLs by mouth 2 (two) times daily between meals.    insulin glargine (LANTUS) 100 UNIT/ML injection Inject 0.05 mLs (5 Units total) into the skin daily.    simethicone (MYLICON) 80 MG chewable tablet Chew 1 tablet (80 mg total) by mouth 4 (four) times daily as needed for flatulence.      CONTINUE these medications which have NOT CHANGED   Details  acetaminophen (TYLENOL) 325 MG tablet Take 2 tablets (650 mg total) by mouth every 6 (six) hours as needed (Maximum dose 2000 mg/day).   Associated Diagnoses: Psoriatic arthritis    calcipotriene (DOVONOX) 0.005 % cream Apply 1 application topically as needed (pain psoriatic arthritis).    furosemide (LASIX) 20 MG tablet Take 20 mg by mouth.    spironolactone (ALDACTONE) 50 MG tablet Take 50 mg by mouth daily.      STOP taking these medications     lactulose (CHRONULAC) 10 GM/15ML solution      potassium chloride SA (K-DUR,KLOR-CON) 20 MEQ tablet        Allergies  Allergen Reactions  .  Codeine Itching    The results of significant diagnostics from this hospitalization (including imaging, microbiology, ancillary and laboratory) are listed below for reference.    Significant Diagnostic Studies: Ct Chest W Contrast  10/29/2014    CLINICAL DATA:  He liver. No chest complaints. Prior smoker for 52 years.  EXAM: CT CHEST WITH CONTRAST  TECHNIQUE: Multidetector CT imaging of the chest was performed during intravenous contrast administration.  CONTRAST:  35 mL Isovue-300  COMPARISON:  None.  FINDINGS: The central airways are patent. The lungs are clear. There is no pleural effusion or pneumothorax.  There are no pathologically enlarged axillary, hilar or mediastinal lymph nodes.  The heart size is normal. There is no pericardial effusion. The thoracic aorta is normal in caliber. Mild coronary artery atherosclerosis in the LAD.  Review of bone windows demonstrates no focal lytic or sclerotic lesions.  Limited non-contrast images of the upper abdomen were obtained. The adrenal glands appear normal. There are innumerable hypodense masses throughout the liver.  IMPRESSION: 1. No findings in the chest to suggest malignancy. 2. No active cardiopulmonary disease. 3. Innumerable hypodense masses throughout the liver. Differential diagnosis includes metastatic disease versus primary hepatic malignancy such as hepatocellular carcinoma.   Electronically Signed   By: Kathreen Devoid   On: 10/29/2014 09:03   US Renal  11/21/2014   CLINICAL DATA:  Renal failure.  Elevated creatinine and BUN.  EXAM: RENAL / URINARY TRACT ULTRASOUND COMPLETE  COMPARISON:  CT of the abdomen and pelvis on 10/22/2014  FINDINGS: Right Kidney:  Length: 13.7 cm. Echogenicity within normal limits. No mass or hydronephrosis visualized.  Left Kidney:  Length: 13.7 cm. Echogenicity within normal limits. No mass or hydronephrosis visualized.  Bladder:  Appears normal for degree of bladder distention.  Ascites and left pleural effusion are noted. Multiple hypoechoic liver lesions are noted.  IMPRESSION: 1. No hydronephrosis or focal renal mass. 2. Ascites, left pleural effusion. 3. Multiple liver masses.   Electronically Signed   By: Nolon Nations M.D.   On: 11/21/2014 14:53   Dg Chest  Port 1 View  11/20/2014   CLINICAL DATA:  Weakness 1 the 3 days, unsteady gait, altered mental status, former smoker  EXAM: PORTABLE CHEST - 1 VIEW  COMPARISON:  None.  FINDINGS: The heart size and mediastinal contours are within normal limits. Both lungs are clear. The visualized skeletal structures are unremarkable.  IMPRESSION: No active disease.   Electronically Signed   By: Skipper Cliche M.D.   On: 11/20/2014 15:03     Labs: Basic Metabolic Panel:  Recent Labs Lab 11/20/14 1217 11/21/14 0531 11/22/14 0605 11/23/14 0512  NA 135 136 136 135  K 3.8 3.8 3.8 3.9  CL 92* 96* 97* 97*  CO2 36* 32 31 29  GLUCOSE 220* 322* 319* 331*  BUN 36* 39* 41* 40*  CREATININE 1.79* 1.77* 1.61* 1.58*  CALCIUM 7.6* 7.2* 7.5* 7.8*  MG 2.4  --   --   --    Liver Function Tests:  Recent Labs Lab 11/20/14 1217 11/22/14 0605 11/23/14 0512  AST 21 20 17   ALT 32 30 33  ALKPHOS 134* 138* 159*  BILITOT 2.0* 0.9 1.1  PROT 5.0* 4.3* 4.5*  ALBUMIN 2.0* 1.5* 1.6*    Recent Labs Lab 11/20/14 1217  AMMONIA 19   CBC:  Recent Labs Lab 11/20/14 1217 11/21/14 0531 11/21/14 1352 11/22/14 0605 11/23/14 0512  WBC 10.7* 10.6*  --  9.8 10.0  HGB 14.5 12.9*  --  12.6* 12.2*  HCT 42.6 37.8*  --  36.5* 36.9*  MCV 92.4 92.2  --  92.6 92.9  PLT 16* 17* 20* 23* 29*    CBG:  Recent Labs Lab 11/25/14 1709 11/25/14 2111 11/26/14 0433 11/26/14 0726 11/26/14 1138  GLUCAP 240* 229* 220* 195* 153*    Principal Problem:   Liver mass Active Problems:   UTI (urinary tract infection)   AKI (acute kidney injury)   Hyperglycemia   Generalized weakness   UTI (lower urinary tract infection)   Weakness   Time coordinating discharge: 45 minutes  Signed:  Murray Hodgkins, MD [facility] 11/26/2014, 2:11 PM

## 2014-11-26 NOTE — Progress Notes (Signed)
  PROGRESS NOTE  Shane Larson ALP:[id number] DOB: 01-Jul-1951 DOA: 11/20/2014 PCP: Marton Redwood, MD   Summary: 63yom with metastatic neuroendocrine tumor presented with generalized weakness.admitted for AKI, generalized weakness.   Assessment/Plan: 1. Metastatic neuroendocrine tumor involving liver. Primary site unknown. Not a candidate for chemotherapy or hepatic therapy per Dr. Benay Spice. Hospice referral made by oncology. 2. AKI. Renal ultrasound showed no obstructive uropathy. Improving with supportive care. 3. Severe thrombocytopenia, stable, secondary to malignancy, liver dysfunction. No evidence of DIC. 4. Generalized weakness. Normal cortisol, TSH, ammonia. Secondary to malignancy. 5. New diagnosis atrial fibrillation, converted to SR. Stable. Given discussion with family/GOC, no treatment at this point. 6. Possible UTI. Treated with Rocephin. No culture was sent. 7. DM type 2, new dx. Hgb A1c 7.3. Stable.   8. Anasarca    To SNF today  Discussed in detail with wife at bedside   Murray Hodgkins, MD  [facility]  Pager [phone] If 7PM-7AM, please contact night-coverage at [email or web address], password [facility] 11/26/2014, 1:54 PM  LOS: 6 days   Consultants:  PT SNF  Procedures:    Antibiotics:  Ceftriaxone 4/30 >> 5/4  HPI/Subjective: Feeling ok, eating ok.  Objective: Filed Vitals:   11/25/14 1355 11/25/14 2028 11/26/14 0436 11/26/14 1348  BP: 151/72 144/79 155/77 148/75  Pulse: 67 69 63 77  Temp: 98.4 F (36.9 C) 98.2 F (36.8 C) 98.1 F (36.7 C) 98.2 F (36.8 C)  TempSrc: Oral Oral Oral Oral  Resp: 16 16 18 16   Height:      Weight:      SpO2: 96% 97% 96% 96%    Intake/Output Summary (Last 24 hours) at 11/26/14 1354 Last data filed at 11/26/14 1349  Gross per 24 hour  Intake    720 ml  Output      0 ml  Net    720 ml     Filed Weights   11/20/14 1645 11/20/14 2139 11/23/14 1205  Weight: 89.812 kg (198 lb) 89.4 kg (197 lb 1.5 oz) 89.4 kg (197  lb 1.5 oz)    Exam:     Afebrile, VSS, no hypoxia General:  Appears calm and comfortable Cardiovascular: RRR, no m/r/g. 3+ bilateral LE edema. Respiratory: CTA bilaterally, no w/r/r. Normal respiratory effort. Psychiatric: grossly normal mood and affect, speech fluent and appropriate  New data reviewed:  Blood sugars stable  Scheduled Meds: . diltiazem  30 mg Oral 4 times per day  . feeding supplement (ENSURE ENLIVE)  237 mL Oral BID BM  . insulin aspart  0-15 Units Subcutaneous TID WC  . insulin aspart  0-5 Units Subcutaneous QHS  . insulin glargine  5 Units Subcutaneous Daily  . pneumococcal 23 valent vaccine  0.5 mL Intramuscular Tomorrow-1000   Continuous Infusions:   Principal Problem:   Liver mass Active Problems:   UTI (urinary tract infection)   AKI (acute kidney injury)   Hyperglycemia   Generalized weakness   UTI (lower urinary tract infection)   Weakness

## 2014-11-26 NOTE — Clinical Social Work Placement (Signed)
Patient is set to discharge to [facility] today. Patient & wife, Shane Larson at bedside aware. Discharge packet given to RN, Baxter Flattery. [facility] called for transport to pickup at 3:30pm.   Raynaldo Opitz, [facility] Social Worker cell #: [phone]    CLINICAL SOCIAL WORK PLACEMENT  NOTE  Date:  11/26/2014  Patient Details  Name: Shane Larson MRN: [id number] Date of Birth: May 30, 1951  Clinical Social Work is seeking post-discharge placement for this patient at the [place] level of care (*CSW will initial, date and re-position this form in  chart as items are completed):  Yes   Patient/family provided with [facility] Work Department's list of facilities offering this level of care within the geographic area requested by the patient (or if unable, by the patient's family).  Yes   Patient/family informed of their freedom to choose among providers that offer the needed level of care, that participate in Medicare, Medicaid or managed care program needed by the patient, have an available bed and are willing to accept the patient.  Yes   Patient/family informed of [facility]'s ownership interest in [facility] and [facility], as well as of the fact that they are under no obligation to receive care at these facilities.  PASRR submitted to EDS on 11/22/14     PASRR number received on 11/22/14     Existing PASRR number confirmed on       FL2 transmitted to all facilities in geographic area requested by pt/family on 11/22/14     FL2 transmitted to all facilities within larger geographic area on       Patient informed that his/her managed care company has contracts with or will negotiate with certain facilities, including the following:        Yes   Patient/family informed of bed offers received.  Patient chooses bed at [place], [place]     Physician recommends and patient chooses bed at       Patient to be transferred to [place], [place] on 11/26/14.  Patient to be transferred to facility by [facility]     Patient family notified on 11/26/14 of transfer.  Name of family member notified:  patient's wife, Shane Larson at bedside     PHYSICIAN       Additional Comment:    _______________________________________________ Standley Brooking, LCSW 11/26/2014, 2:38 PM

## 2014-12-03 ENCOUNTER — Telehealth: Payer: Self-pay | Admitting: *Deleted

## 2014-12-03 NOTE — Telephone Encounter (Signed)
Shane Larson currently at [facility] in [place]. Wife reports he is doing well-not in pain, more interactive and smiles. Participates in physical therapy. She is getting the home prepared for him to come to for his remaining weeks or months. She can see a progression of his disease-hope he will live to their anniversary on June 22nd. Hospice has been helping her get things ready and cope. She will be sending a fax for MD to complete next week for the home care and plans to keep his appointment on 5/18. Appreciates the call.

## 2014-12-08 ENCOUNTER — Telehealth: Payer: Self-pay | Admitting: Oncology

## 2014-12-08 ENCOUNTER — Ambulatory Visit (HOSPITAL_BASED_OUTPATIENT_CLINIC_OR_DEPARTMENT_OTHER): Payer: BLUE CROSS/BLUE SHIELD | Admitting: Oncology

## 2014-12-08 VITALS — BP 153/92 | HR 96 | Temp 98.8°F | Resp 18 | Ht 74.5 in

## 2014-12-08 DIAGNOSIS — C7B8 Other secondary neuroendocrine tumors: Secondary | ICD-10-CM | POA: Diagnosis not present

## 2014-12-08 DIAGNOSIS — N289 Disorder of kidney and ureter, unspecified: Secondary | ICD-10-CM

## 2014-12-08 DIAGNOSIS — C7A8 Other malignant neuroendocrine tumors: Secondary | ICD-10-CM

## 2014-12-08 DIAGNOSIS — D696 Thrombocytopenia, unspecified: Secondary | ICD-10-CM

## 2014-12-08 DIAGNOSIS — A047 Enterocolitis due to Clostridium difficile: Secondary | ICD-10-CM

## 2014-12-08 DIAGNOSIS — I4891 Unspecified atrial fibrillation: Secondary | ICD-10-CM

## 2014-12-08 DIAGNOSIS — L8990 Pressure ulcer of unspecified site, unspecified stage: Secondary | ICD-10-CM

## 2014-12-08 NOTE — Telephone Encounter (Signed)
Lft msg for pt confirming MD visit per 05/18 POF...... Mailed out schedule... KJ

## 2014-12-08 NOTE — Progress Notes (Signed)
  Shane Larson   Diagnosis: Metastatic neuroendocrine tumor  INTERVAL HISTORY:   Shane Larson returns for scheduled visit. He was discharged to a skilled nursing facility 11/26/2014. He is being treated for C. difficile colitis. Diarrhea is improving. He continues to have edema. He is purchased pitting and physical therapy. Shane Larson reports his affect improved with Wellbutrin. He is being treated for decubitus ulcer. He plans to return home with Hospice care next week. No pain. A lift is required for him to get out of bed. Objective:  Vital signs in last 24 hours:  Blood pressure 153/92, pulse 96, temperature 98.8 F (37.1 C), temperature source Oral, resp. rate 18, height 6' 2.5" (1.892 m), SpO2 97 %.    HEENT: No thrush Resp: Scattered coarse rhonchi, no respiratory distress Cardio: Regular rate and rhythm GI: No hepatomegaly, nontender Vascular: Anasarca with 2+ edema at the bilateral leg Neuro: Alert and oriented       Lab Results:  Lab Results  Component Value Date   WBC 10.0 11/23/2014   HGB 12.2* 11/23/2014   HCT 36.9* 11/23/2014   MCV 92.9 11/23/2014   PLT 29* 11/23/2014   NEUTROABS 6.0 11/08/2014    Medications: I have reviewed the patient's current medications.  Assessment/Plan: 1. 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 History of alcohol use 3. CT scan evidence of cirrhosis 10/22/2014 4. Thrombocytopenia. 5. Anasarca secondary to hypoalbuminemia 6. Renal insufficiency 7. Atrial fibrillation   8.       C. difficile colitis  Disposition:  Shane Larson appears unchanged. He continues to have a poor performance status in the setting of a  metastatic neuroendocrine tumor. He is not a candidate for systemic chemotherapy. I recommend home Hospice care when he is discharged from the skilled nursing facility. He will continue care of the decubitus ulcer by the skin care RN. He will return for an office visit here in 3-4 weeks. Myself or Dr. Brigitte Pulse can serve as the primary M.D. with hospice.  Betsy Coder, MD  12/08/2014  12:29 PM

## 2014-12-13 ENCOUNTER — Ambulatory Visit: Payer: BLUE CROSS/BLUE SHIELD | Admitting: Internal Medicine

## 2014-12-15 ENCOUNTER — Telehealth: Payer: Self-pay

## 2014-12-15 NOTE — Telephone Encounter (Signed)
Left message at hospice for [facility] that Dr Benay Spice will be the attending with hospice MDs assisting with symptom management.

## 2014-12-15 NOTE — Telephone Encounter (Signed)
Pt is going home from [facility] with hospice services. Shane Larson was calling to find out if Dr Benay Spice will be the attending. Her [phone].

## 2014-12-22 ENCOUNTER — Other Ambulatory Visit: Payer: Self-pay | Admitting: *Deleted

## 2014-12-22 NOTE — Progress Notes (Signed)
POF to scheduler to move his 6/14 with BS to NP.

## 2014-12-23 ENCOUNTER — Telehealth: Payer: Self-pay | Admitting: Oncology

## 2014-12-23 NOTE — Telephone Encounter (Signed)
Lft msg for pt confirming MD visit r/s from MD to ML per 06/01 POF same day, mailed schedule to pt... KJ

## 2014-12-23 NOTE — Telephone Encounter (Signed)
S/w pt's wife confirming apt change from MD/ML but states pt won't be coming back that pt is in Hospice care. I sent a msg to MD/ML advising of this and cancelled the apt. .... KJ

## 2014-12-24 ENCOUNTER — Telehealth: Payer: Self-pay | Admitting: *Deleted

## 2014-12-28 ENCOUNTER — Telehealth: Payer: Self-pay | Admitting: Oncology

## 2014-12-28 NOTE — Telephone Encounter (Signed)
Received death certificate 02/22/42

## 2015-01-04 ENCOUNTER — Ambulatory Visit: Payer: BLUE CROSS/BLUE SHIELD | Admitting: Nurse Practitioner

## 2015-01-21 NOTE — Telephone Encounter (Signed)
Received call from [facility] with hospice reporting pt passed away today.

## 2015-01-21 DEATH — deceased

## 2016-03-08 IMAGING — CT CT CHEST W/ CM
2 of 3 series · 15 of 30 positions shown, 17 images · IV contrast (75CC ISOVUE 300)
Comparison: None.

CLINICAL DATA: He liver. No chest complaints. Prior smoker for 52
years.

EXAM:
CT CHEST WITH CONTRAST
TECHNIQUE: Multidetector CT imaging of the chest was performed during
intravenous contrast administration.
CONTRAST:  35 mL 6sovue-ZWW

[Series 3: chest with · axial · 0.72mm/px · z∈[-328,-78]mm · 7 of 68 slices shown, 9 images]
[im 9/68  mediastinal]
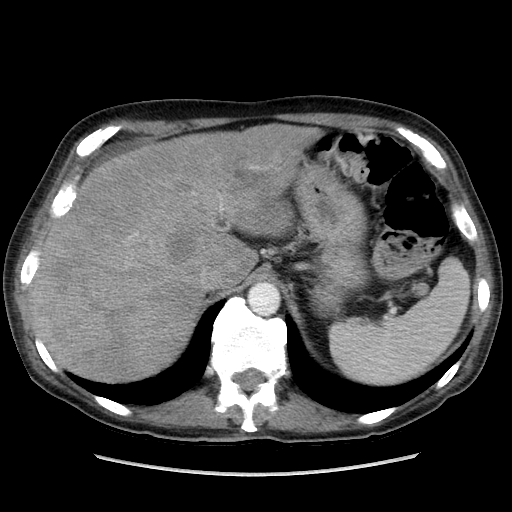
[im 9/68  lung]
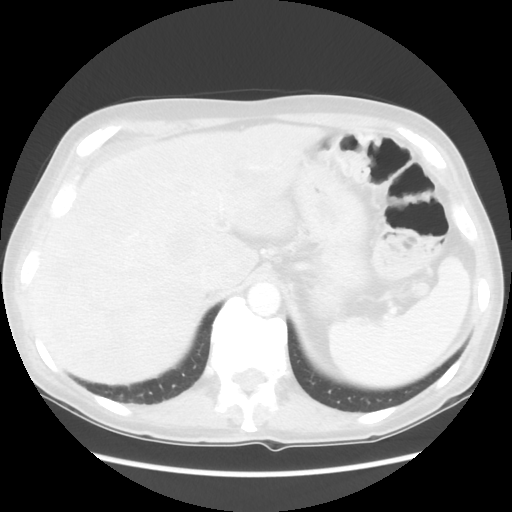
[im 17/68  lung]
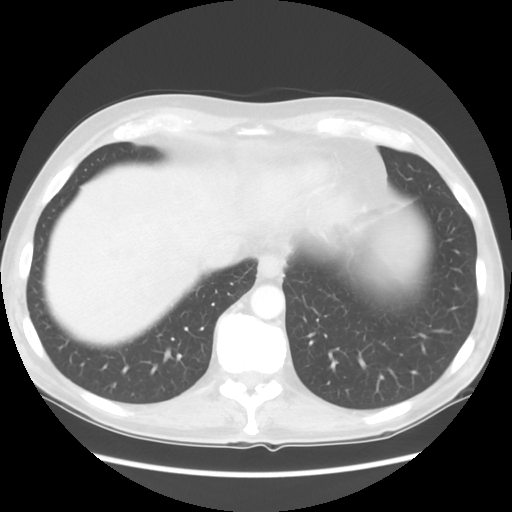
[im 26/68  lung]
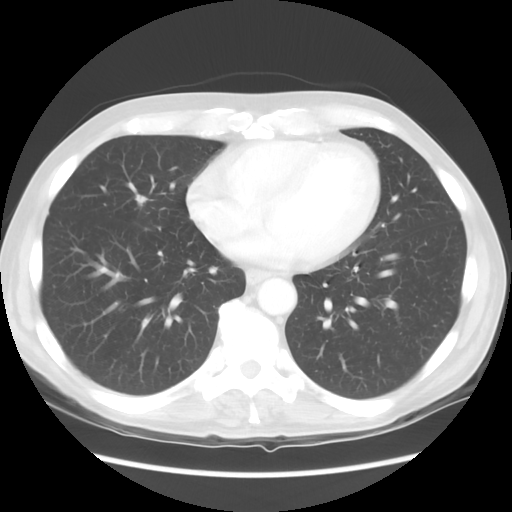
[im 34/68  lung]
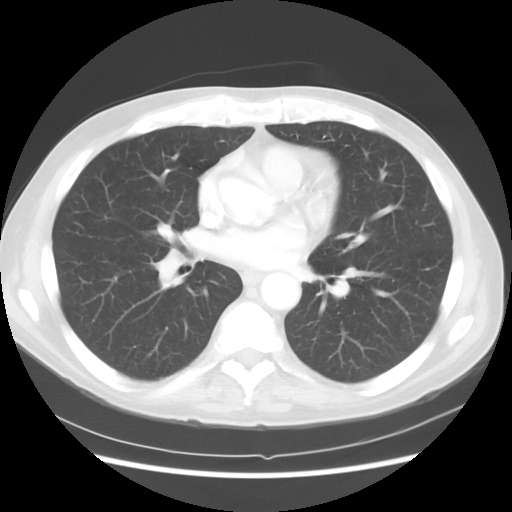
[im 42/68  mediastinal]
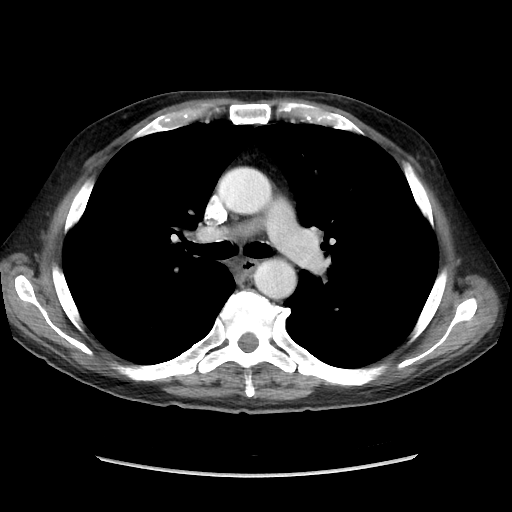
[im 42/68  lung]
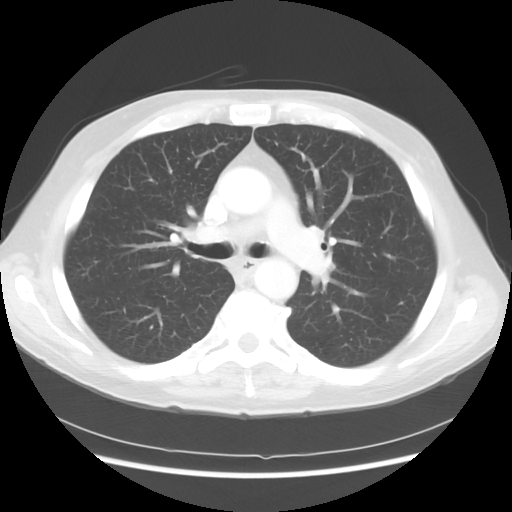
[im 51/68  lung]
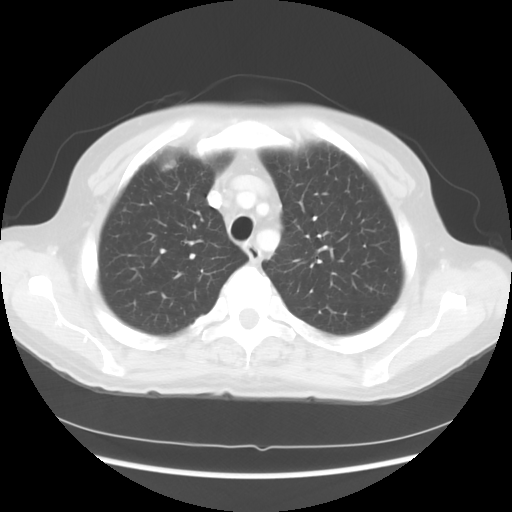
[im 59/68  lung]
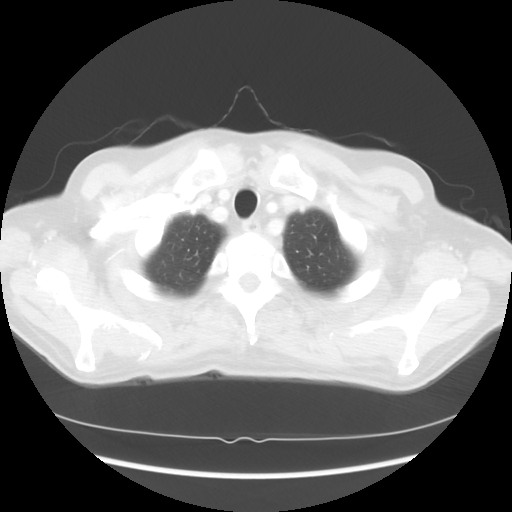

[Series 602: sagittal body · sagittal · 0.72mm/px · 8 of 149 slices shown]
[im 17/149  mediastinal]
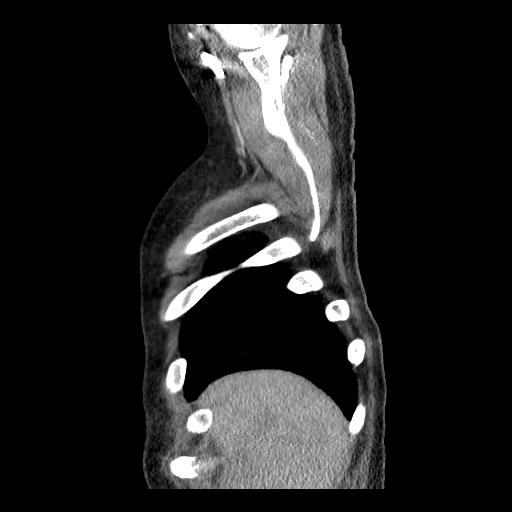
[im 33/149  mediastinal]
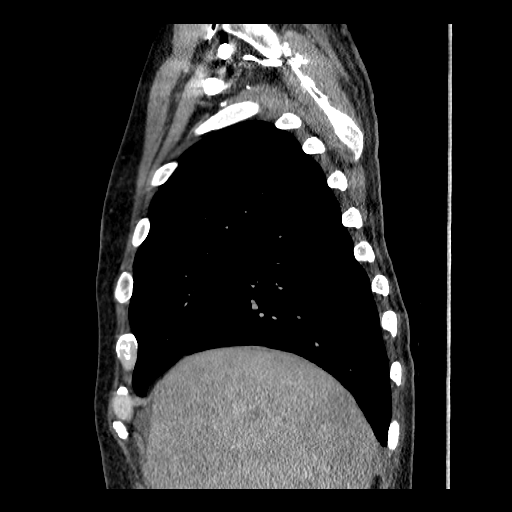
[im 50/149  mediastinal]
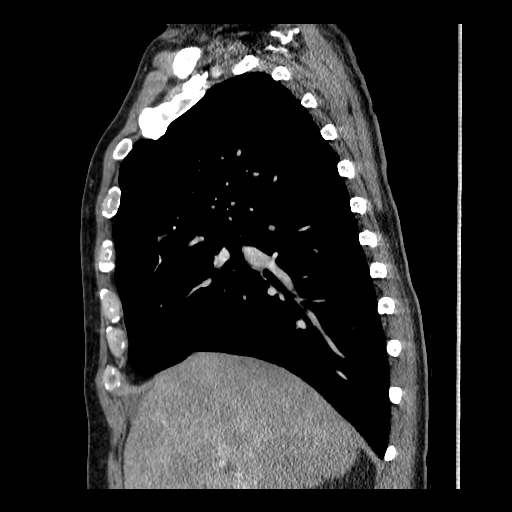
[im 66/149  mediastinal]
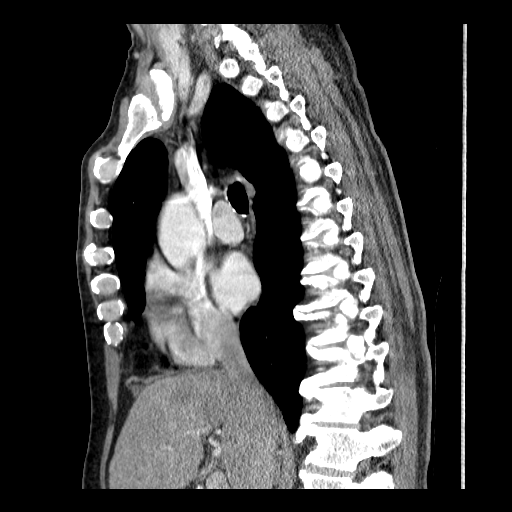
[im 83/149  mediastinal]
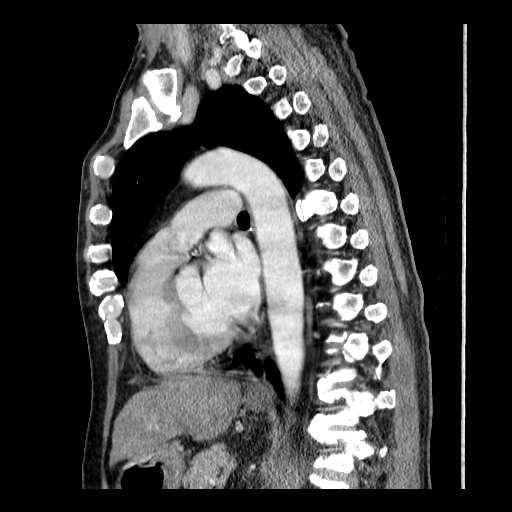
[im 99/149  mediastinal]
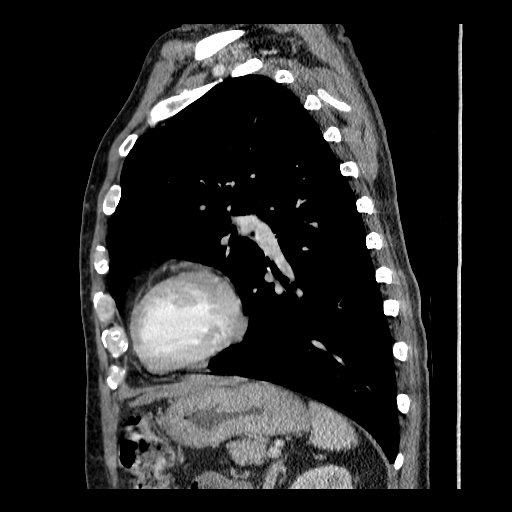
[im 116/149  mediastinal]
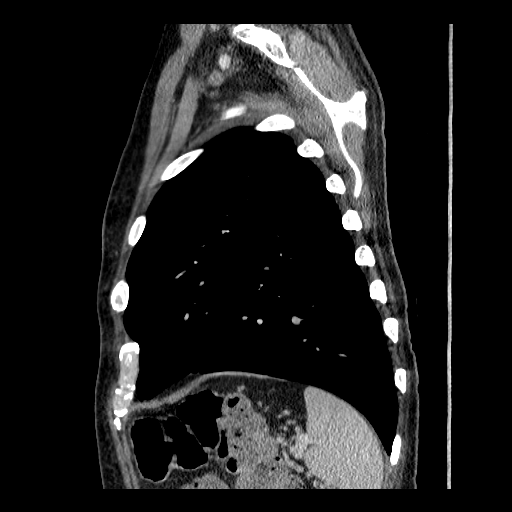
[im 132/149  mediastinal]
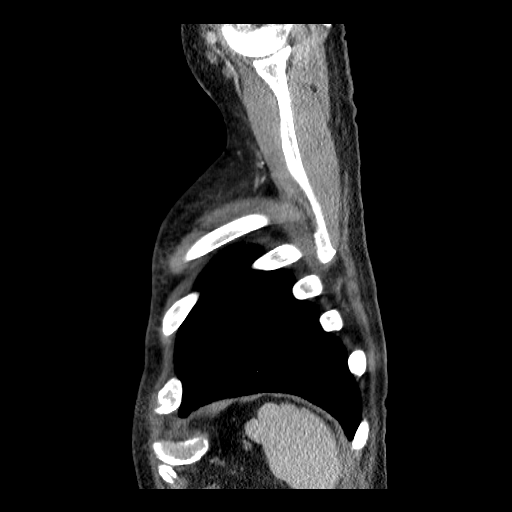

[15 of 30 positions shown; findings below may reference images not displayed]

FINDINGS: The central airways are patent. The lungs are clear. There is no
pleural effusion or pneumothorax.

There are no pathologically enlarged axillary, hilar or mediastinal
lymph nodes.

The heart size is normal. There is no pericardial effusion. The
thoracic aorta is normal in caliber. Mild coronary artery
atherosclerosis in the LAD.

Review of bone windows demonstrates no focal lytic or sclerotic
lesions.

Limited non-contrast images of the upper abdomen were obtained. The
adrenal glands appear normal. There are innumerable hypodense masses
throughout the liver.
IMPRESSION: 1. No findings in the chest to suggest malignancy.
2. No active cardiopulmonary disease.
3. Innumerable hypodense masses throughout the liver. Differential
diagnosis includes metastatic disease versus primary hepatic
malignancy such as hepatocellular carcinoma.

## 2016-04-01 IMAGING — US US RENAL
1 series · 14 of 25 positions shown · non-contrast
Comparison: CT of the abdomen and pelvis on 10/22/2014

CLINICAL DATA: Renal failure.  Elevated creatinine and BUN.

EXAM:
RENAL / URINARY TRACT ULTRASOUND COMPLETE

[Series 1: us renal · 0.25mm/px · 14 of 41 slices shown]
[im 1/41]
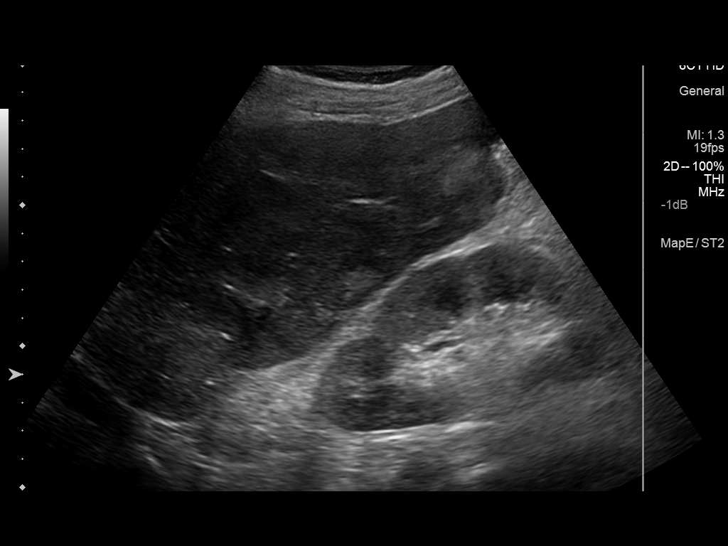
[im 4/41]
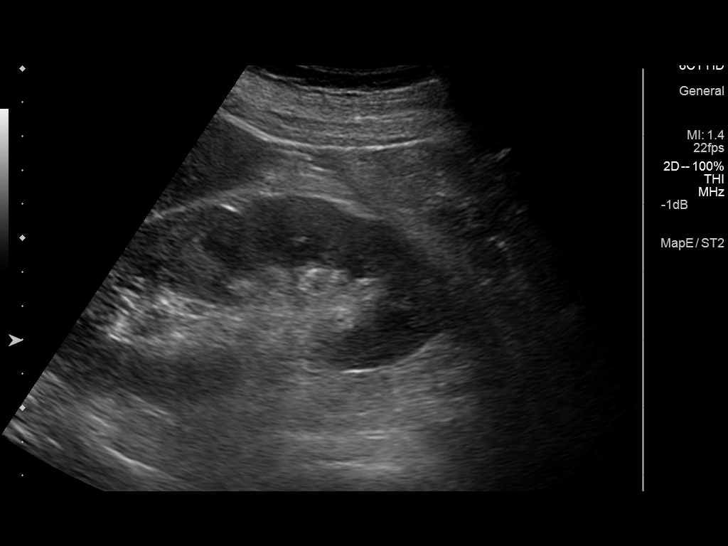
[im 7/41]
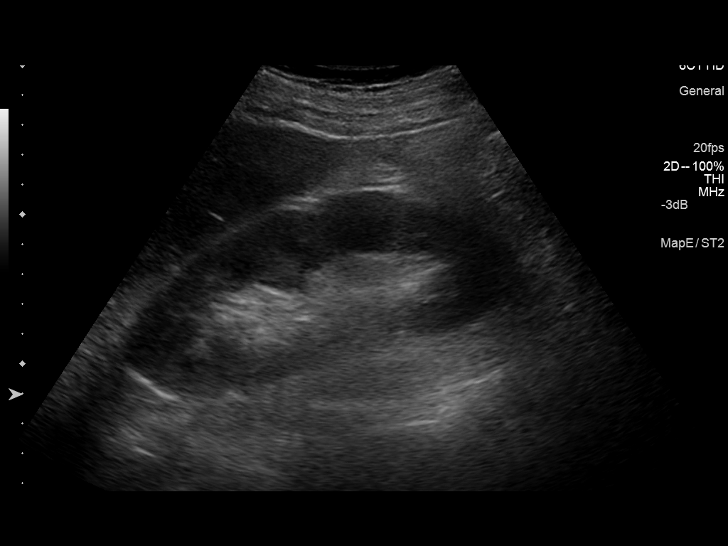
[im 11/41]
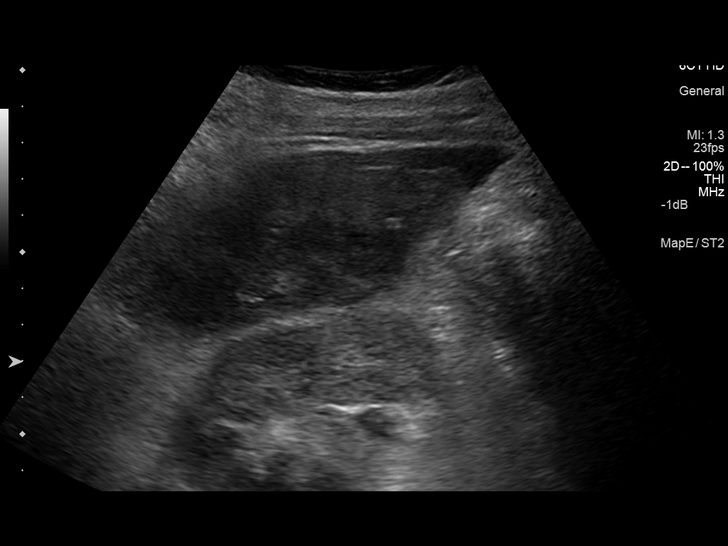
[im 14/41]
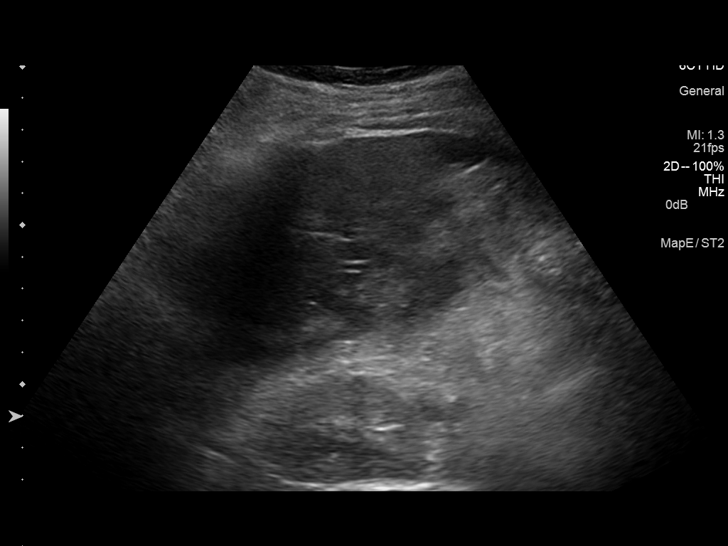
[im 16/41]
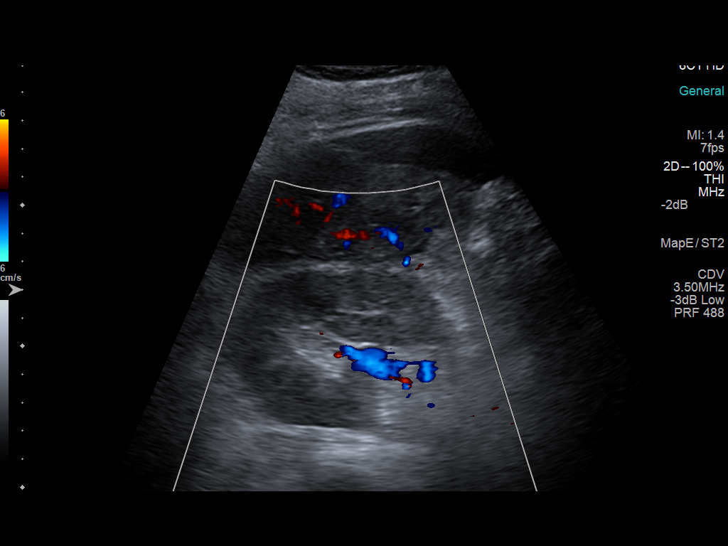
[im 19/41]
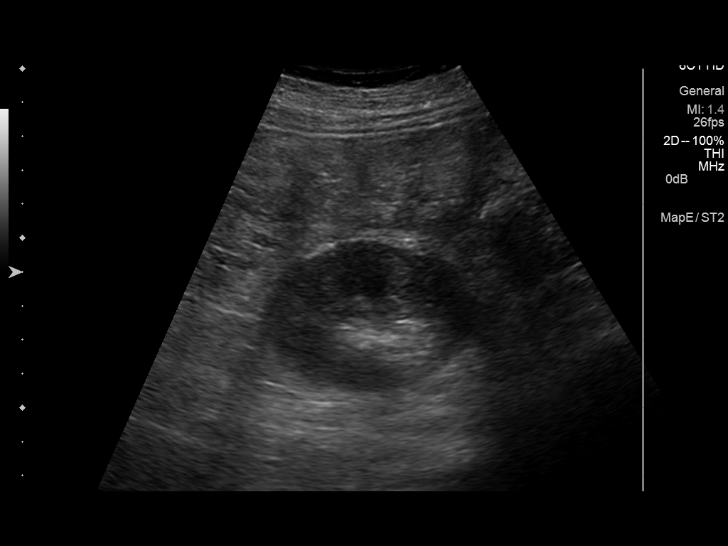
[im 22/41]
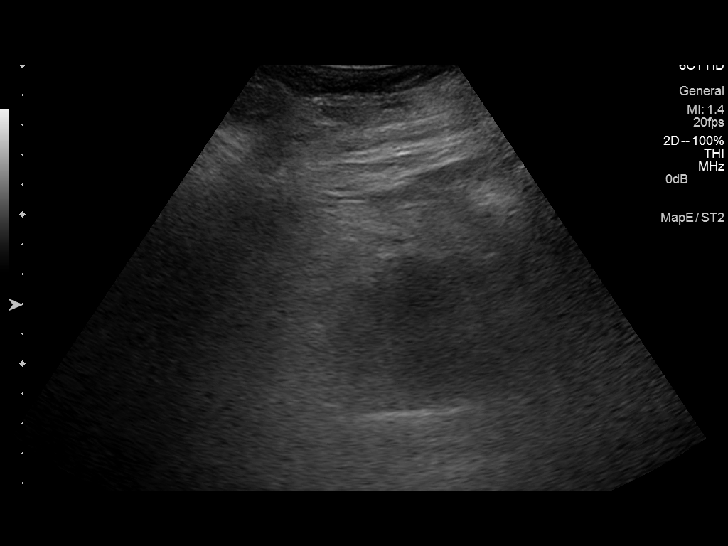
[im 26/41]
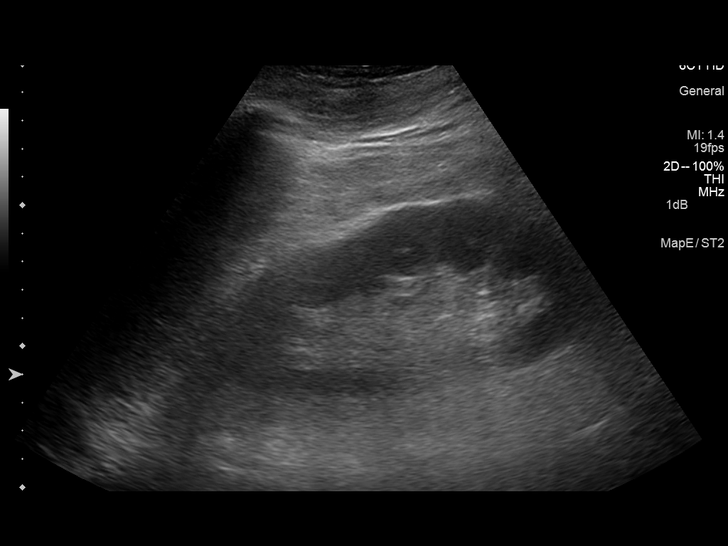
[im 27/41]
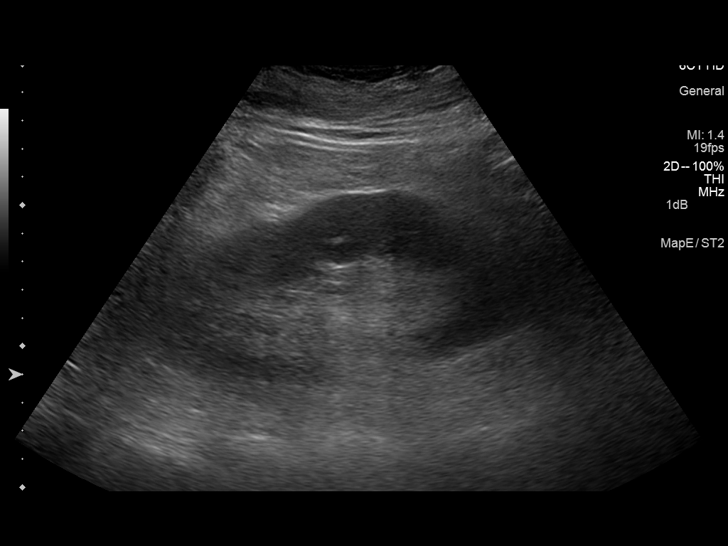
[im 31/41]
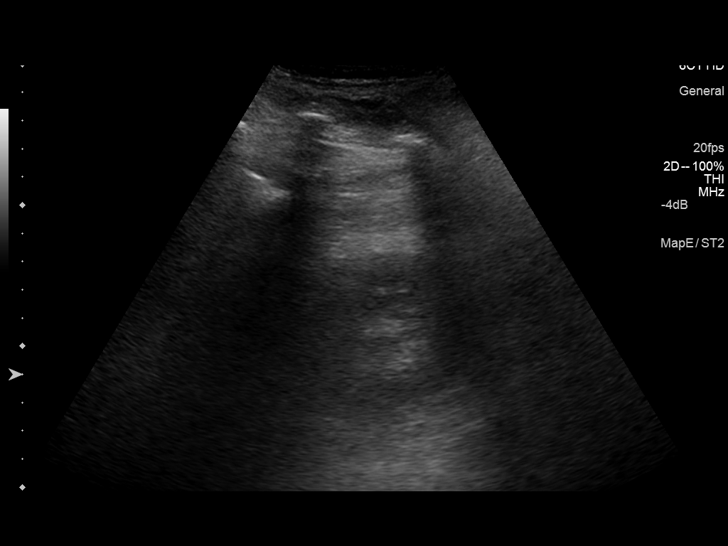
[im 34/41]
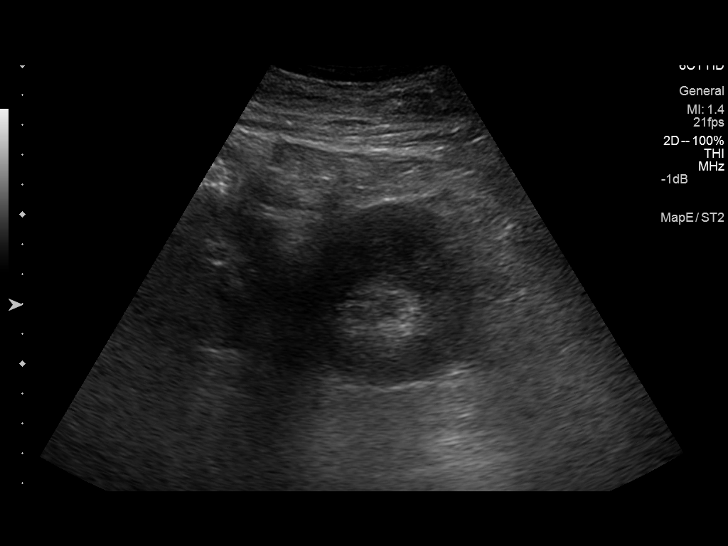
[im 37/41]
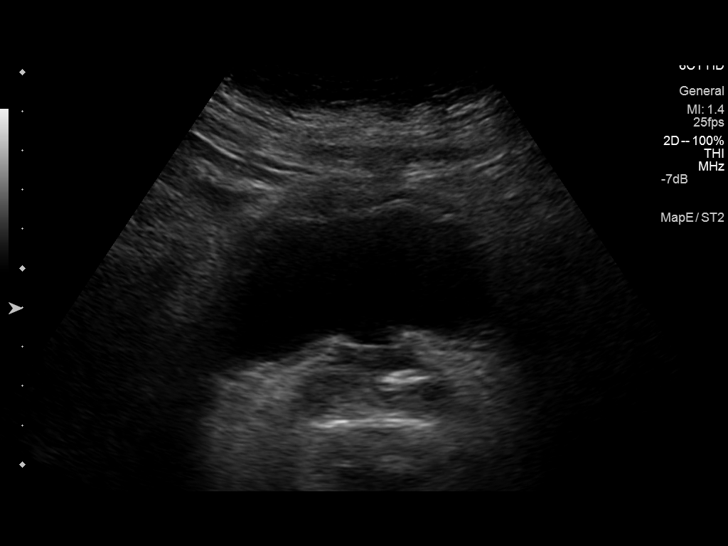
[im 41/41]
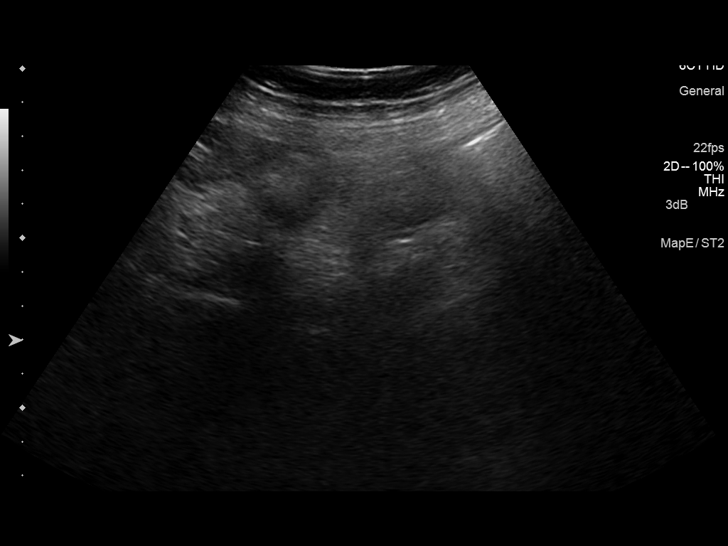

[14 of 25 positions shown; findings below may reference images not displayed]

FINDINGS: Right Kidney:

Length: 13.7 cm. Echogenicity within normal limits. No mass or
hydronephrosis visualized.

Left Kidney:

Length: 13.7 cm. Echogenicity within normal limits. No mass or
hydronephrosis visualized.

Bladder:

Appears normal for degree of bladder distention.

Ascites and left pleural effusion are noted. Multiple hypoechoic
liver lesions are noted.
IMPRESSION: 1. No hydronephrosis or focal renal mass.
2. Ascites, left pleural effusion.
3. Multiple liver masses.
